# Patient Record
Sex: Female | Born: 1975 | Race: Black or African American | Hispanic: No | Marital: Single | Smoking: Current every day smoker
Health system: Southern US, Community
[De-identification: ages and names within clinical notes are randomized; demographics above are authoritative.]

## PROBLEM LIST (undated history)

## (undated) DIAGNOSIS — F32A Depression, unspecified: Secondary | ICD-10-CM

## (undated) DIAGNOSIS — G4730 Sleep apnea, unspecified: Secondary | ICD-10-CM

## (undated) DIAGNOSIS — E119 Type 2 diabetes mellitus without complications: Secondary | ICD-10-CM

## (undated) DIAGNOSIS — K219 Gastro-esophageal reflux disease without esophagitis: Secondary | ICD-10-CM

## (undated) DIAGNOSIS — N921 Excessive and frequent menstruation with irregular cycle: Secondary | ICD-10-CM

## (undated) DIAGNOSIS — J45909 Unspecified asthma, uncomplicated: Secondary | ICD-10-CM

## (undated) DIAGNOSIS — E669 Obesity, unspecified: Secondary | ICD-10-CM

## (undated) DIAGNOSIS — I10 Essential (primary) hypertension: Secondary | ICD-10-CM

## (undated) DIAGNOSIS — R519 Headache, unspecified: Secondary | ICD-10-CM

## (undated) DIAGNOSIS — T8332XA Displacement of intrauterine contraceptive device, initial encounter: Secondary | ICD-10-CM

## (undated) DIAGNOSIS — R7989 Other specified abnormal findings of blood chemistry: Secondary | ICD-10-CM

## (undated) DIAGNOSIS — D649 Anemia, unspecified: Secondary | ICD-10-CM

## (undated) DIAGNOSIS — F419 Anxiety disorder, unspecified: Secondary | ICD-10-CM

## (undated) DIAGNOSIS — K649 Unspecified hemorrhoids: Secondary | ICD-10-CM

## (undated) DIAGNOSIS — F329 Major depressive disorder, single episode, unspecified: Secondary | ICD-10-CM

## (undated) DIAGNOSIS — J189 Pneumonia, unspecified organism: Secondary | ICD-10-CM

## (undated) DIAGNOSIS — T8859XA Other complications of anesthesia, initial encounter: Secondary | ICD-10-CM

## (undated) HISTORY — DX: Sleep apnea, unspecified: G47.30

## (undated) HISTORY — PX: BREAST REDUCTION SURGERY: SHX8

## (undated) HISTORY — DX: Other specified abnormal findings of blood chemistry: R79.89

## (undated) HISTORY — DX: Unspecified hemorrhoids: K64.9

## (undated) HISTORY — DX: Obesity, unspecified: E66.9

## (undated) HISTORY — DX: Major depressive disorder, single episode, unspecified: F32.9

## (undated) HISTORY — DX: Type 2 diabetes mellitus without complications: E11.9

## (undated) HISTORY — DX: Depression, unspecified: F32.A

## (undated) HISTORY — PX: TONSILLECTOMY: SUR1361

## (undated) HISTORY — DX: Gastro-esophageal reflux disease without esophagitis: K21.9

---

## 2015-12-02 ENCOUNTER — Other Ambulatory Visit: Payer: Self-pay | Admitting: Family Medicine

## 2015-12-02 ENCOUNTER — Ambulatory Visit (INDEPENDENT_AMBULATORY_CARE_PROVIDER_SITE_OTHER): Payer: BLUE CROSS/BLUE SHIELD | Admitting: Family Medicine

## 2015-12-02 ENCOUNTER — Encounter: Payer: Self-pay | Admitting: Family Medicine

## 2015-12-02 ENCOUNTER — Ambulatory Visit
Admission: RE | Admit: 2015-12-02 | Discharge: 2015-12-02 | Disposition: A | Discharge status: Home / Self Care | Payer: BLUE CROSS/BLUE SHIELD | Source: Ambulatory Visit | Attending: Family Medicine | Admitting: Family Medicine

## 2015-12-02 DIAGNOSIS — M25562 Pain in left knee: Secondary | ICD-10-CM

## 2015-12-02 DIAGNOSIS — E538 Deficiency of other specified B group vitamins: Secondary | ICD-10-CM

## 2015-12-02 DIAGNOSIS — E119 Type 2 diabetes mellitus without complications: Secondary | ICD-10-CM | POA: Diagnosis not present

## 2015-12-02 DIAGNOSIS — Z720 Tobacco use: Secondary | ICD-10-CM | POA: Diagnosis not present

## 2015-12-02 DIAGNOSIS — Z833 Family history of diabetes mellitus: Secondary | ICD-10-CM | POA: Diagnosis not present

## 2015-12-02 DIAGNOSIS — F32A Depression, unspecified: Secondary | ICD-10-CM | POA: Insufficient documentation

## 2015-12-02 DIAGNOSIS — E559 Vitamin D deficiency, unspecified: Secondary | ICD-10-CM | POA: Diagnosis not present

## 2015-12-02 DIAGNOSIS — M25561 Pain in right knee: Secondary | ICD-10-CM | POA: Diagnosis not present

## 2015-12-02 DIAGNOSIS — R159 Full incontinence of feces: Secondary | ICD-10-CM | POA: Diagnosis not present

## 2015-12-02 DIAGNOSIS — F329 Major depressive disorder, single episode, unspecified: Secondary | ICD-10-CM

## 2015-12-02 DIAGNOSIS — F17200 Nicotine dependence, unspecified, uncomplicated: Secondary | ICD-10-CM

## 2015-12-02 DIAGNOSIS — M545 Low back pain, unspecified: Secondary | ICD-10-CM

## 2015-12-02 DIAGNOSIS — M222X1 Patellofemoral disorders, right knee: Secondary | ICD-10-CM

## 2015-12-02 LAB — POCT URINALYSIS DIPSTICK
Bilirubin, UA: NEGATIVE
Blood, UA: NEGATIVE
GLUCOSE UA: NEGATIVE
Ketones, UA: NEGATIVE
LEUKOCYTES UA: NEGATIVE
Nitrite, UA: NEGATIVE
Protein, UA: NEGATIVE
SPEC GRAV UA: 1.015
UROBILINOGEN UA: 0.2
pH, UA: 6

## 2015-12-02 NOTE — Progress Notes (Signed)
Date:  12/02/2015   Name:  Robin Hammond   DOB:  Apr 09, 1976   MRN:  [id number]  PCP:  Schuyler Amor, MD    Chief Complaint: Establish Care and Knee Pain   History of Present Illness:  This is a 40 y.o. female for initial visit. BMI 57, has gained 50# past year, no regular exercise. Thinks might be depressed (family death last year) but wants to avoid antidepressant medication. C/o 8 month hx R knee intermittent popping/swelling/instability, improves somewhat with heat. C/o 3d hx LBP with occ radiation to R buttock, can't lie flat. Taking Naprosyn prn which helps. C/o urge to defecate with eating and fecal incontinence with cough or sneeze. C/o chronic perimenstrual pain, irregular periods. Mother with DM, tetanus imm 2015, no hx colonoscopy, no mammo or pap/pelvic in 2 years. Smoker, not interested in quitting at present, helps nerves.  Review of Systems:  Review of Systems  Constitutional: Negative for fever.  Respiratory: Negative for shortness of breath.   Cardiovascular: Negative for chest pain and leg swelling.  Gastrointestinal: Negative for abdominal pain.  Endocrine: Negative for polyuria.  Genitourinary: Negative for difficulty urinating.  Neurological: Negative for syncope and light-headedness.    Patient Active Problem List   Diagnosis Date Noted  . Obesity, morbid, BMI 50 or higher (HCC) 12/02/2015  . Depression 12/02/2015  . FH: diabetes mellitus 12/02/2015  . Bilateral low back pain without sciatica 12/02/2015  . Right knee pain 12/02/2015  . Fecal incontinence 12/02/2015    Prior to Admission medications   Medication Sig Start Date End Date Taking? Authorizing Provider  naproxen (NAPROSYN) 500 MG tablet Take 500 mg by mouth 2 (two) times daily as needed.   Yes Historical Provider, MD    Allergies  Allergen Reactions  . Hydrocodone     Past Surgical History  Procedure Laterality Date  . Breast reduction surgery    . Tonsillectomy      Social History   Substance Use Topics  . Smoking status: Current Every Day Smoker  . Smokeless tobacco: None  . Alcohol Use: 0.0 oz/week    0 Standard drinks or equivalent per week    Family History  Problem Relation Age of Onset  . Diabetes Mother   . Hypertension Mother   . Cancer Maternal Grandmother   . Cancer Paternal Grandmother   . Cancer Paternal Grandfather     Medication list has been reviewed and updated.  Physical Examination: BP 104/82 mmHg  Pulse 80  Ht  (1.626 m)  Wt 334 lb (151.501 kg)  BMI 57.30 kg/m2  LMP 08/29/2015  Physical Exam  Constitutional: She is oriented to person, place, and time. She appears well-developed and well-nourished.  HENT:  Head: Normocephalic and atraumatic.  Right Ear: External ear normal.  Left Ear: External ear normal.  Nose: Nose normal.  Mouth/Throat: Oropharynx is clear and moist.  TM's clear  Eyes: Conjunctivae and EOM are normal. Pupils are equal, round, and reactive to light. No scleral icterus.  Neck: Neck supple. No thyromegaly present.  Cardiovascular: Normal rate, regular rhythm and normal heart sounds.   Pulmonary/Chest: Effort normal and breath sounds normal.  Abdominal: Soft. She exhibits no distension and no mass. There is no tenderness.  Musculoskeletal: She exhibits no edema.  Lymphadenopathy:    She has no cervical adenopathy.  Neurological: She is alert and oriented to person, place, and time. Coordination normal.  Skin: Skin is warm and dry.  Psychiatric: Her behavior is normal.  Depressed affect,  nearly tearful at times  Nursing note and vitals reviewed.   Assessment and Plan:  1. Obesity, morbid, BMI 50 or higher (HCC) Unclear etiology, worsening, check labs, exercise discussed, consider MNT - TSH - Comprehensive Metabolic Panel (CMET) - CBC - Vitamin D (25 hydroxy) - HgB A1c - ACTH - Lipid Profile - Cortisol  2. Right knee pain Suspect PFS, consider PT referral if XR negative - DG Knee Complete 4  Views Right; Future  3. Depression Declines medication, consider counseling referral - B12  4. Fecal incontinence - Ambulatory referral to Gastroenterology  5. Bilateral low back pain without sciatica UA negative, cont Naprosyn/heat prn - POCT Urinalysis Dipstick  6. Smoker Advised cessation  7. FH: diabetes mellitus - POCT Urinalysis Dipstick  Return in about 4 weeks (around 12/30/2015).  Dionne AnoWilliam M. Kingsley SpittlePlonk, Jr. MD [facility]  12/02/2015

## 2015-12-03 ENCOUNTER — Telehealth: Payer: Self-pay

## 2015-12-03 DIAGNOSIS — E119 Type 2 diabetes mellitus without complications: Secondary | ICD-10-CM | POA: Insufficient documentation

## 2015-12-03 DIAGNOSIS — E538 Deficiency of other specified B group vitamins: Secondary | ICD-10-CM | POA: Insufficient documentation

## 2015-12-03 DIAGNOSIS — E559 Vitamin D deficiency, unspecified: Secondary | ICD-10-CM | POA: Insufficient documentation

## 2015-12-03 LAB — COMPREHENSIVE METABOLIC PANEL
A/G RATIO: 1.1 (ref 1.1–2.5)
ALT: 24 IU/L (ref 0–32)
AST: 20 IU/L (ref 0–40)
Albumin: 4 g/dL (ref 3.5–5.5)
Alkaline Phosphatase: 79 IU/L (ref 39–117)
BUN/Creatinine Ratio: 9 (ref 9–23)
BUN: 7 mg/dL (ref 6–24)
Bilirubin Total: <0.2 mg/dL (ref 0.0–1.2)
CALCIUM: 9.6 mg/dL (ref 8.7–10.2)
CO2: 25 mmol/L (ref 18–29)
CREATININE: 0.82 mg/dL (ref 0.57–1.00)
Chloride: 102 mmol/L (ref 96–106)
GFR, EST AFRICAN AMERICAN: 104 mL/min/{1.73_m2} (ref >59)
GFR, EST NON AFRICAN AMERICAN: 90 mL/min/{1.73_m2} (ref >59)
GLOBULIN, TOTAL: 3.5 g/dL (ref 1.5–4.5)
Glucose: 117 mg/dL — ABNORMAL HIGH (ref 65–99)
POTASSIUM: 4.4 mmol/L (ref 3.5–5.2)
Sodium: 143 mmol/L (ref 134–144)
TOTAL PROTEIN: 7.5 g/dL (ref 6.0–8.5)

## 2015-12-03 LAB — HEMOGLOBIN A1C
Est. average glucose Bld gHb Est-mCnc: 151 mg/dL
Hgb A1c MFr Bld: 6.9 % — ABNORMAL HIGH (ref 4.8–5.6)

## 2015-12-03 LAB — CBC
HEMOGLOBIN: 13.6 g/dL (ref 11.1–15.9)
Hematocrit: 41 % (ref 34.0–46.6)
MCH: 30 pg (ref 26.6–33.0)
MCHC: 33.2 g/dL (ref 31.5–35.7)
MCV: 91 fL (ref 79–97)
Platelets: 304 10*3/uL (ref 150–379)
RBC: 4.53 x10E6/uL (ref 3.77–5.28)
RDW: 14.6 % (ref 12.3–15.4)
WBC: 10.8 10*3/uL (ref 3.4–10.8)

## 2015-12-03 LAB — VITAMIN D 25 HYDROXY (VIT D DEFICIENCY, FRACTURES): Vit D, 25-Hydroxy: 15.6 ng/mL — ABNORMAL LOW (ref 30.0–100.0)

## 2015-12-03 LAB — TSH: TSH: 3.38 u[IU]/mL (ref 0.450–4.500)

## 2015-12-03 LAB — LIPID PANEL
CHOLESTEROL TOTAL: 155 mg/dL (ref 100–199)
Chol/HDL Ratio: 3.4 ratio units (ref 0.0–4.4)
HDL: 45 mg/dL (ref >39)
LDL CALC: 74 mg/dL (ref 0–99)
TRIGLYCERIDES: 182 mg/dL — AB (ref 0–149)
VLDL CHOLESTEROL CAL: 36 mg/dL (ref 5–40)

## 2015-12-03 LAB — ACTH: ACTH: 15.3 pg/mL (ref 7.2–63.3)

## 2015-12-03 LAB — VITAMIN B12: Vitamin B-12: 291 pg/mL (ref 211–946)

## 2015-12-03 LAB — CORTISOL: Cortisol: 3.2 ug/dL

## 2015-12-03 MED ORDER — METFORMIN HCL 500 MG PO TABS: 500.0000 mg | ORAL_TABLET | Freq: Two times a day (BID) | ORAL | Status: DC

## 2015-12-03 MED ORDER — VITAMIN B-12 1000 MCG PO TABS: 1000.0000 ug | ORAL_TABLET | Freq: Every day | ORAL | Status: DC

## 2015-12-03 MED ORDER — VITAMIN D 50 MCG (2000 UT) PO CAPS: 1.0000 | ORAL_CAPSULE | Freq: Every day | ORAL | Status: DC

## 2015-12-03 NOTE — Telephone Encounter (Signed)
-----   Message from Schuyler AmorWilliam Plonk, MD sent at 12/03/2015 10:04 AM EST ----- Inform blood work shows diabetes, low vit D level, and low B12 level (should be over 300). Recommend begin metformin 500 mg bid (rx sent), OTC vit D3 2000 units daily, and OTC vit B12 1000 mcg daily.

## 2015-12-03 NOTE — Addendum Note (Signed)
Addended by: Schuyler AmorPLONK, Kynlie Jane on: 12/03/2015 10:08 AM   Modules accepted: Orders, SmartSet

## 2015-12-03 NOTE — Telephone Encounter (Signed)
Spoke with patient. Patient advised of all results and verbalized understanding. Will call back with any future questions or concerns. MAH  

## 2015-12-04 ENCOUNTER — Encounter: Payer: Self-pay | Admitting: Gastroenterology

## 2015-12-04 ENCOUNTER — Ambulatory Visit (INDEPENDENT_AMBULATORY_CARE_PROVIDER_SITE_OTHER): Payer: BLUE CROSS/BLUE SHIELD | Admitting: Gastroenterology

## 2015-12-04 VITALS — BP 131/98 | HR 82 | Temp 99.0°F | Ht 64.0 in | Wt 334.0 lb

## 2015-12-04 DIAGNOSIS — R198 Other specified symptoms and signs involving the digestive system and abdomen: Secondary | ICD-10-CM

## 2015-12-04 DIAGNOSIS — R197 Diarrhea, unspecified: Secondary | ICD-10-CM

## 2015-12-04 DIAGNOSIS — K219 Gastro-esophageal reflux disease without esophagitis: Secondary | ICD-10-CM

## 2015-12-04 DIAGNOSIS — R194 Change in bowel habit: Secondary | ICD-10-CM

## 2015-12-04 MED ORDER — PANTOPRAZOLE SODIUM 40 MG PO TBEC: 40.0000 mg | DELAYED_RELEASE_TABLET | Freq: Every day | ORAL | Status: DC

## 2015-12-04 MED ORDER — DICYCLOMINE HCL 20 MG PO TABS: 20.0000 mg | ORAL_TABLET | Freq: Three times a day (TID) | ORAL | Status: DC

## 2015-12-04 NOTE — Progress Notes (Signed)
Gastroenterology Consultation  Referring Provider:     Schuyler Hammond, William, MD Primary Care Physician:  Robin AmorWilliam Plonk, MD Primary Gastroenterologist:  Dr. Servando Hammond     Reason for Consultation:     Stool incontinence        HPI:   Robin Hammond is a 40 y.o. y/o female referred for consultation & management of  Stool incontinence and heartburn by Dr. Schuyler AmorWilliam Plonk, MD.   This patient comes today with a history of frequent bowel movements after she eats. The patient reports that shortly after having a meal she has to run for the bathroom. She also reports that her stools are loose. She has had problems with incontinence when she coughs or sneezes. She also reports that her mother had colon polyps at the age of 40. There is no report of any unexplained weight loss and in fact the patient states she has gained weight. She has been under a lot of stress. The patient has daily heartburn for which she does not take any medication. There is no report of any food getting stuck in the esophagus or having to bring food back up.   The patient reports that she has been having some bright red blood on the toilet paper. She does not see any blood in the stool or in the water. She also denies any constipation.  Past Medical History  Diagnosis Date  . Hemorrhoids   . Diabetes mellitus without complication (HCC)   . Depression     Past Surgical History  Procedure Laterality Date  . Breast reduction surgery    . Tonsillectomy      Prior to Admission medications   Medication Sig Start Date End Date Taking? Authorizing Provider  metFORMIN (GLUCOPHAGE) 500 MG tablet Take 1 tablet (500 mg total) by mouth 2 (two) times daily with a meal. 12/03/15  Yes Robin AmorWilliam Plonk, MD  naproxen (NAPROSYN) 500 MG tablet Take 500 mg by mouth 2 (two) times daily as needed.   Yes Historical Provider, MD  vitamin B-12 (CYANOCOBALAMIN) 1000 MCG tablet Take 1 tablet (1,000 mcg total) by mouth daily. 12/03/15  Yes Robin AmorWilliam Plonk, MD    Cholecalciferol (VITAMIN D) 2000 units CAPS Take 1 capsule (2,000 Units total) by mouth daily. Patient not taking: Reported on 12/04/2015 12/03/15   Robin AmorWilliam Plonk, MD  dicyclomine (BENTYL) 20 MG tablet Take 1 tablet (20 mg total) by mouth 3 (three) times daily before meals. 12/04/15   Robin Hammond Robin Curto, MD  pantoprazole (PROTONIX) 40 MG tablet Take 1 tablet (40 mg total) by mouth daily before breakfast. 12/04/15   Robin Hammond Robin Stansel, MD    Family History  Problem Relation Age of Onset  . Diabetes Mother   . Hypertension Mother   . Cancer Maternal Grandmother   . Cancer Paternal Grandmother   . Cancer Paternal Grandfather      Social History  Substance Use Topics  . Smoking status: Current Every Day Smoker  . Smokeless tobacco: Never Used  . Alcohol Use: 0.0 oz/week    0 Standard drinks or equivalent per week    Allergies as of 12/04/2015 - Review Complete 12/04/2015  Allergen Reaction Noted  . Hydrocodone  12/02/2015    Review of Systems:    All systems reviewed and negative except where noted in HPI.   Physical Exam:  BP 131/98 mmHg  Pulse 82  Temp(Src) 99 F (37.2 C)  Ht 5\' 4"  (1.626 m)  Wt 334 lb (151.501 kg)  BMI 57.30 kg/m2  LMP  08/29/2015 Patient's last menstrual period was 08/29/2015. Psych:  Alert and cooperative. Normal mood and affect. General:   Alert,  Well-developed,  Obese,well-nourished, pleasant and cooperative in NAD Head:  Normocephalic and atraumatic. Eyes:  Sclera clear, no icterus.   Conjunctiva pink. Ears:  Normal auditory acuity. Nose:  No deformity, discharge, or lesions. Mouth:  No deformity or lesions,oropharynx pink & moist. Neck:  Supple; no masses or thyromegaly. Lungs:  Respirations even and unlabored.  Clear throughout to auscultation.   No wheezes, crackles, or rhonchi. No acute distress. Heart:  Regular rate and rhythm; no murmurs, clicks, rubs, or gallops. Abdomen:  Normal bowel sounds.  No bruits.  Soft, non-tender and non-distended without masses,  hepatosplenomegaly or hernias noted.  No guarding or rebound tenderness.  Negative Carnett sign.   Rectal:  Deferred.  Msk:  Symmetrical without gross deformities.  Good, equal movement & strength bilaterally. Pulses:  Normal pulses noted. Extremities:  No clubbing or edema.  No cyanosis. Neurologic:  Alert and oriented x3;  grossly normal neurologically. Skin:  Intact without significant lesions or rashes.  No jaundice. Lymph Nodes:  No significant cervical adenopathy. Psych:  Alert and cooperative. Normal mood and affect.  Imaging Studies: Dg Knee Complete 4 Views Right  12/02/2015  CLINICAL DATA:  Intermittent right medial knee pain for the past 6 months without known injury ; weight-bearing is painful. EXAM: RIGHT KNEE - COMPLETE 4+ VIEW COMPARISON:  None in PACs FINDINGS: The bones of the right knee are adequately mineralized. The joint spaces are preserved. There is no acute fracture nor dislocation. There is no joint effusion. Specific attention to the patella reveals no acute bony abnormality. There are small spurs arising from the lateral articular margin of the patella. IMPRESSION: There is no acute bony abnormality of the right knee. Very mild spurring is noted involving the patella. Electronically Signed   By: David  [place] M.D.   On: 12/02/2015 16:19    Assessment and Plan:   Robin Hammond is a 40 y.o. y/o female who comes in today with a change in bowel habits predominantly consisting of diarrhea. The patient reports that she has to run to the bathroom shortly after she eats and has had some stool incontinence. She also reports that she has abdominal cramps with this. The patient will be started on a trial of dicyclomine before every meal. She will also be set up for a colonoscopy due to her change in bowel habits, diarrhea and family history of colon polyps. The patient has will have Protonix ordered for her for her daily heartburn.  I have discussed risks & benefits which include, but  are not limited to, bleeding, infection, perforation & drug reaction.  The patient agrees with this plan & written consent will be obtained.    Note: This dictation was prepared with Dragon dictation along with smaller phrase technology. Any transcriptional errors that result from this process are unintentional.

## 2015-12-05 ENCOUNTER — Other Ambulatory Visit: Payer: Self-pay

## 2015-12-16 ENCOUNTER — Telehealth: Payer: Self-pay

## 2015-12-16 ENCOUNTER — Other Ambulatory Visit: Payer: Self-pay

## 2015-12-16 DIAGNOSIS — E119 Type 2 diabetes mellitus without complications: Secondary | ICD-10-CM

## 2015-12-16 NOTE — Telephone Encounter (Signed)
Patient afraid what will happen if not on meds. I explained how A1C works and that hers is not Fatal read and we have room to wiggle. She will call back Wed to see how she is feeling and awaits referral to [facility] I have ordered this. Patient asked will she start meds again or need new on and I advised diet changes and said lets see how you are in few days and after [facility].

## 2015-12-16 NOTE — Telephone Encounter (Signed)
Ok thanks 

## 2015-12-16 NOTE — Telephone Encounter (Signed)
Stop metformin, refer to [facility] for diabetic education please.

## 2015-12-16 NOTE — Telephone Encounter (Signed)
Patient is feeling Dizzy and shaking since taking Metformin. Just Dx with diabetes and said she was never told how to eat or given BS machine when asked if she is checking sugars. I asked has she eaten anything she said just breakfast and small lunch and she said she does not know what to eat as diabetic. I also asked if she was having 3 meals and 2 snacks and she said no she was never told to. I explained this is part of any diet and that I will see if you want to order Kit for BS testing or if she needs [facility]. She said she has not had any chest pains or confussion only dizzy and some nausea. Sherrill Raring has her on meds keeping her from having lose stools so not sure if she would have had diarrhea or not. What should I do?

## 2015-12-24 ENCOUNTER — Telehealth: Payer: Self-pay

## 2015-12-24 ENCOUNTER — Ambulatory Visit: Payer: BLUE CROSS/BLUE SHIELD | Admitting: Physical Therapy

## 2015-12-24 NOTE — Telephone Encounter (Signed)
Sent to Plonk 

## 2015-12-25 MED ORDER — CITALOPRAM HYDROBROMIDE 20 MG PO TABS: 20.0000 mg | ORAL_TABLET | Freq: Every day | ORAL | Status: DC

## 2015-12-25 NOTE — Telephone Encounter (Signed)
I can call in antidepressant. Has she tried any in the past? Will also do [facility] referral.

## 2015-12-25 NOTE — Addendum Note (Signed)
Addended by: Schuyler AmorPLONK, Jackob Crookston on: 12/25/2015 11:47 AM   Modules accepted: Orders

## 2015-12-25 NOTE — Telephone Encounter (Signed)
Please send in Antidepressant for pt- has NOT tried anything in past- will sched follow up for 3 weeks and yes she does want a lifestyle referral please

## 2015-12-26 ENCOUNTER — Ambulatory Visit: Payer: BLUE CROSS/BLUE SHIELD | Admitting: Family Medicine

## 2015-12-30 ENCOUNTER — Ambulatory Visit: Payer: BLUE CROSS/BLUE SHIELD | Admitting: Family Medicine

## 2015-12-31 ENCOUNTER — Encounter: Payer: BLUE CROSS/BLUE SHIELD | Attending: Family Medicine | Admitting: Dietician

## 2015-12-31 ENCOUNTER — Encounter: Payer: Self-pay | Admitting: Dietician

## 2015-12-31 VITALS — BP 110/82 | Ht 64.0 in | Wt 330.5 lb

## 2015-12-31 DIAGNOSIS — E119 Type 2 diabetes mellitus without complications: Secondary | ICD-10-CM | POA: Insufficient documentation

## 2015-12-31 NOTE — Progress Notes (Signed)
Diabetes Self-Management Education  Visit Type: First/Initial  Appt. Start Time: 1030 Appt. End Time: 1130  12/31/2015  [name], identified by name and date of birth, is a 40 y.o. female with a diagnosis of Diabetes: Type 2.   ASSESSMENT  Blood pressure 110/82, height  (1.626 m), weight 330 lb 8 oz (149.914 kg), last menstrual period 08/29/2015. Body mass index is 56.7 kg/(m^2).  Obesity Lacks knowledge of diabetes care and diet Is not testing BG's-does not have a BG meter      Diabetes Self-Management Education - 12/31/15 1327    Visit Information   Visit Type First/Initial   Initial Visit   Diabetes Type Type 2   Health Coping   How would you rate your overall health? Fair   Psychosocial Assessment   Patient Belief/Attitude about Diabetes Motivated to manage diabetes   Self-care barriers None   Patient Concerns Medication;Monitoring;Healthy Lifestyle;Problem Solving;Glycemic Control;Weight Control;Nutrition/Meal planning  quit smoking   Special Needs None   Preferred Learning Style Auditory   Learning Readiness Ready   What is the last grade level you completed in school? 14   Complications   Last HgB A1C per patient/outside source 6.9 %  per pt   How often do you check your blood sugar? 0 times/day (not testing)   Have you had a dilated eye exam in the past 12 months? No   Have you had a dental exam in the past 12 months? No   Are you checking your feet? Yes   How many days per week are you checking your feet? 7   Dietary Intake   Breakfast --  pt works 5:30p-3:30a-usually eats cold cereal and milk after work before going to sleep then eats breakfast meal at 10-11a when awakens   Lunch --  eats at 5p; eats fried foods , snack foods and sweets daily   Dinner --  eats at [employer]) --  drinks 5+ fruit juices/day, 4 glasses of sweetened beverages; drinks very little water   Exercise   Exercise Type --  no regular exercise   Patient Education    Previous Diabetes Education No   Disease state  --  discussed pathophysiology of type 2 diabetes and treatment options   Nutrition management  Role of diet in the treatment of diabetes and the relationship between the three main macronutrients and blood glucose level;Food label reading, portion sizes and measuring food.;Carbohydrate counting   Physical activity and exercise  Role of exercise on diabetes management, blood pressure control and cardiac health.;Helped patient identify appropriate exercises in relation to his/her diabetes, diabetes complications and other health issue.   Medications --  pt was on metformin but stopped it due to GI symptoms   Monitoring Taught/evaluated SMBG meter.;Purpose and frequency of SMBG.;Taught/discussed recording of test results and interpretation of SMBG.;Identified appropriate SMBG and/or A1C goals.;Yearly dilated eye exam  gave pt Ultra One Touch 2 meter and instructed on its use-FBG 91   Chronic complications Relationship between chronic complications and blood glucose control;Dental care;Retinopathy and reason for yearly dilated eye exams   Personal strategies to promote health Lifestyle issues that need to be addressed for better diabetes care-gave pt information on free  smoking cessation classes      Individualized Plan for Diabetes Self-Management Training:   Learning Objective:  Patient will have a greater understanding of diabetes self-management. Patient education plan is to attend individual and/or group sessions per assessed needs and concerns.   Plan:  Check BG's 2x/day-FBG and 2 hr pp and record Try walking 10-15 min 3-4x/wk. Avoid sweetened beverages and fruit juices-drink plenty of water Limit fried foods, snack foods and sweets Make healthy food choices Eat 3 balanced meals/day-include 2-3 carbohydrate servings/meal + protein Eat a small bedtime snack-include 1 carbohydrate serving/snack + protein Make dental and eye  appointments Call if questions Quit smoking   Expected Outcomes:   positive  Education material provided: General Meal Planning Guidelines, Ultra One Touch 2 meter  If problems or questions, patient to contact team via:  [phone]  Future DSME appointment:  01-09-16

## 2016-01-09 ENCOUNTER — Telehealth: Payer: Self-pay | Admitting: Gastroenterology

## 2016-01-09 ENCOUNTER — Ambulatory Visit: Payer: BLUE CROSS/BLUE SHIELD

## 2016-01-09 NOTE — Telephone Encounter (Signed)
I have called [employer] [place] and spoke with Lamarr LulasNeka J. No authorization is required for CPT: [id number] as long as it remains outpatient for DOS:01/14/16 with Dr Servando SnareWohl.

## 2016-01-13 ENCOUNTER — Telehealth: Payer: Self-pay | Admitting: Gastroenterology

## 2016-01-13 ENCOUNTER — Encounter: Payer: Self-pay | Admitting: Dietician

## 2016-01-13 ENCOUNTER — Telehealth: Payer: Self-pay | Admitting: Dietician

## 2016-01-13 NOTE — Telephone Encounter (Signed)
Pt called on 01-09-16 and cancelled diabetes classes-does not want to reschedule

## 2016-01-13 NOTE — Telephone Encounter (Signed)
Questions answer about colonoscopy prep instructions. Pt requested an email with [facility]. Emailed to: [email or web address].

## 2016-01-13 NOTE — Telephone Encounter (Signed)
Patient left a voice message regarding some questions about her procedure tomorrow.

## 2016-01-14 ENCOUNTER — Encounter
Admission: RE | Disposition: A | Discharge status: Home / Self Care | Payer: Self-pay | Source: Ambulatory Visit | Attending: Gastroenterology

## 2016-01-14 ENCOUNTER — Ambulatory Visit: Payer: BLUE CROSS/BLUE SHIELD | Admitting: Anesthesiology

## 2016-01-14 ENCOUNTER — Ambulatory Visit
Admission: RE | Admit: 2016-01-14 | Discharge: 2016-01-14 | Disposition: A | Discharge status: Home / Self Care | Payer: BLUE CROSS/BLUE SHIELD | Source: Ambulatory Visit | Attending: Gastroenterology | Admitting: Gastroenterology

## 2016-01-14 ENCOUNTER — Encounter: Payer: Self-pay | Admitting: Anesthesiology

## 2016-01-14 DIAGNOSIS — Z79899 Other long term (current) drug therapy: Secondary | ICD-10-CM | POA: Diagnosis not present

## 2016-01-14 DIAGNOSIS — E119 Type 2 diabetes mellitus without complications: Secondary | ICD-10-CM | POA: Diagnosis not present

## 2016-01-14 DIAGNOSIS — K219 Gastro-esophageal reflux disease without esophagitis: Secondary | ICD-10-CM | POA: Insufficient documentation

## 2016-01-14 DIAGNOSIS — Z6841 Body Mass Index (BMI) 40.0 and over, adult: Secondary | ICD-10-CM | POA: Insufficient documentation

## 2016-01-14 DIAGNOSIS — K640 First degree hemorrhoids: Secondary | ICD-10-CM | POA: Insufficient documentation

## 2016-01-14 DIAGNOSIS — Z7984 Long term (current) use of oral hypoglycemic drugs: Secondary | ICD-10-CM | POA: Diagnosis not present

## 2016-01-14 DIAGNOSIS — Z885 Allergy status to narcotic agent status: Secondary | ICD-10-CM | POA: Insufficient documentation

## 2016-01-14 DIAGNOSIS — Z809 Family history of malignant neoplasm, unspecified: Secondary | ICD-10-CM | POA: Insufficient documentation

## 2016-01-14 DIAGNOSIS — F17210 Nicotine dependence, cigarettes, uncomplicated: Secondary | ICD-10-CM | POA: Diagnosis not present

## 2016-01-14 DIAGNOSIS — Z833 Family history of diabetes mellitus: Secondary | ICD-10-CM | POA: Diagnosis not present

## 2016-01-14 DIAGNOSIS — R197 Diarrhea, unspecified: Secondary | ICD-10-CM | POA: Diagnosis present

## 2016-01-14 DIAGNOSIS — F329 Major depressive disorder, single episode, unspecified: Secondary | ICD-10-CM | POA: Diagnosis not present

## 2016-01-14 DIAGNOSIS — K529 Noninfective gastroenteritis and colitis, unspecified: Secondary | ICD-10-CM | POA: Insufficient documentation

## 2016-01-14 DIAGNOSIS — G4730 Sleep apnea, unspecified: Secondary | ICD-10-CM | POA: Insufficient documentation

## 2016-01-14 DIAGNOSIS — E559 Vitamin D deficiency, unspecified: Secondary | ICD-10-CM | POA: Diagnosis not present

## 2016-01-14 DIAGNOSIS — E669 Obesity, unspecified: Secondary | ICD-10-CM | POA: Insufficient documentation

## 2016-01-14 DIAGNOSIS — Z8249 Family history of ischemic heart disease and other diseases of the circulatory system: Secondary | ICD-10-CM | POA: Diagnosis not present

## 2016-01-14 HISTORY — PX: COLONOSCOPY WITH PROPOFOL: SHX5780

## 2016-01-14 LAB — GLUCOSE, CAPILLARY: GLUCOSE-CAPILLARY: 106 mg/dL — AB (ref 65–99)

## 2016-01-14 LAB — POCT PREGNANCY, URINE: PREG TEST UR: NEGATIVE

## 2016-01-14 SURGERY — COLONOSCOPY WITH PROPOFOL
Anesthesia: General

## 2016-01-14 MED ORDER — FENTANYL CITRATE (PF) 100 MCG/2ML IJ SOLN
INTRAMUSCULAR | Status: DC | PRN
  Administered 2016-01-14: 50 ug via INTRAVENOUS

## 2016-01-14 MED ORDER — PHENYLEPHRINE HCL 10 MG/ML IJ SOLN
INTRAMUSCULAR | Status: DC | PRN
  Administered 2016-01-14 (×2): 150 ug via INTRAVENOUS

## 2016-01-14 MED ORDER — SODIUM CHLORIDE 0.9 % IV SOLN
INTRAVENOUS | Status: DC
  Administered 2016-01-14 (×2): via INTRAVENOUS

## 2016-01-14 MED ORDER — MIDAZOLAM HCL 2 MG/2ML IJ SOLN
INTRAMUSCULAR | Status: DC | PRN
  Administered 2016-01-14: 1 mg via INTRAVENOUS

## 2016-01-14 MED ORDER — PROPOFOL 10 MG/ML IV BOLUS
INTRAVENOUS | Status: DC | PRN
  Administered 2016-01-14: 30 mg via INTRAVENOUS
  Administered 2016-01-14: 40 mg via INTRAVENOUS

## 2016-01-14 MED ORDER — PROPOFOL 500 MG/50ML IV EMUL
INTRAVENOUS | Status: DC | PRN
  Administered 2016-01-14: 80 ug/kg/min via INTRAVENOUS

## 2016-01-14 NOTE — H&P (Signed)
[facility]  [address] [place], [place] [address] Phone: [phone] Fax : [phone]  Primary Care Physician:  Schuyler Amor, MD Primary Gastroenterologist:  Dr. Servando Snare  Pre-Procedure History & Physical: HPI:  Robin Hammond is a 40 y.o. female is here for an colonoscopy.   Past Medical History  Diagnosis Date  . Hemorrhoids   . Diabetes mellitus without complication (HCC)   . Depression   . GERD (gastroesophageal reflux disease)   . Low serum vitamin D   . Obesity   . Sleep apnea     Past Surgical History  Procedure Laterality Date  . Breast reduction surgery    . Tonsillectomy      Prior to Admission medications   Medication Sig Start Date End Date Taking? Authorizing Provider  Cholecalciferol (VITAMIN D) 2000 units CAPS Take 1 capsule (2,000 Units total) by mouth daily. Patient not taking: Reported on 12/04/2015 12/03/15   Schuyler Amor, MD  citalopram (CELEXA) 20 MG tablet Take 1 tablet (20 mg total) by mouth daily. 12/25/15   Schuyler Amor, MD  dicyclomine (BENTYL) 20 MG tablet Take 1 tablet (20 mg total) by mouth 3 (three) times daily before meals. Patient not taking: Reported on 12/31/2015 12/04/15   Midge Minium, MD  metFORMIN (GLUCOPHAGE) 500 MG tablet Take 1 tablet (500 mg total) by mouth 2 (two) times daily with a meal. Patient not taking: Reported on 12/31/2015 12/03/15   Schuyler Amor, MD  naproxen (NAPROSYN) 500 MG tablet Take 500 mg by mouth 2 (two) times daily as needed.    Historical Provider, MD  pantoprazole (PROTONIX) 40 MG tablet Take 1 tablet (40 mg total) by mouth daily before breakfast. 12/04/15   Midge Minium, MD  vitamin B-12 (CYANOCOBALAMIN) 1000 MCG tablet Take 1 tablet (1,000 mcg total) by mouth daily. 12/03/15   Schuyler Amor, MD    Allergies as of 12/05/2015 - Review Complete 12/04/2015  Allergen Reaction Noted  . Hydrocodone  12/02/2015    Family History  Problem Relation Age of Onset  . Diabetes Mother   . Hypertension  Mother   . Cancer Maternal Grandmother   . Cancer Paternal Grandmother   . Cancer Paternal Grandfather     Social History   Social History  . Marital Status: Single    Spouse Name: N/A  . Number of Children: N/A  . Years of Education: N/A   Occupational History  . Not on file.   Social History Main Topics  . Smoking status: Current Every Day Smoker -- 0.30 packs/day    Types: Cigarettes  . Smokeless tobacco: Never Used  . Alcohol Use: 1.8 - 2.4 oz/week    3-4 Standard drinks or equivalent per week  . Drug Use: No  . Sexual Activity: Not Currently   Other Topics Concern  . Not on file   Social History Narrative    Review of Systems: See HPI, otherwise negative ROS  Physical Exam: BP 127/81 mmHg  Pulse 79  Temp(Src) 98.8 F (37.1 C) (Tympanic)  Resp 21  Ht  (1.626 m)  Wt 336 lb (152.409 kg)  BMI 57.65 kg/m2  SpO2 99% General:   Alert,  pleasant and cooperative in NAD Head:  Normocephalic and atraumatic. Neck:  Supple; no masses or thyromegaly. Lungs:  Clear throughout to auscultation.    Heart:  Regular rate and rhythm. Abdomen:  Soft, nontender and nondistended. Normal bowel sounds, without guarding, and without rebound.   Neurologic:  Alert and  oriented  x4;  grossly normal neurologically.  Impression/Plan: Robin Hammond is here for an colonoscopy to be performed for diarrhea  Risks, benefits, limitations, and alternatives regarding  colonoscopy have been reviewed with the patient.  Questions have been answered.  All parties agreeable.   [name] [name], MD  01/14/2016, 8:34 AM

## 2016-01-14 NOTE — Anesthesia Preprocedure Evaluation (Addendum)
Anesthesia Evaluation  Patient identified by MRN, date of birth, ID band Patient awake    Reviewed: Allergy & Precautions, NPO status , Patient's Chart, lab work & pertinent test results, reviewed documented beta blocker date and time   Airway Mallampati: III  TM Distance: >3 FB     Dental  (+) Chipped, Missing, Dental Advisory Given   Pulmonary sleep apnea , Current Smoker,           Cardiovascular      Neuro/Psych PSYCHIATRIC DISORDERS Depression    GI/Hepatic GERD  ,  Endo/Other  diabetes, Type obesity  Renal/GU      Musculoskeletal   Abdominal   Peds  Hematology   Anesthesia Other Findings No cpap.   Reproductive/Obstetrics                            Anesthesia Physical Anesthesia Plan  ASA: III  Anesthesia Plan: General   Post-op Pain Management:    Induction: Intravenous  Airway Management Planned:   Additional Equipment:   Intra-op Plan:   Post-operative Plan:   Informed Consent: I have reviewed the patients History and Physical, chart, labs and discussed the procedure including the risks, benefits and alternatives for the proposed anesthesia with the patient or authorized representative who has indicated his/her understanding and acceptance.     Plan Discussed with: CRNA  Anesthesia Plan Comments:         Anesthesia Quick Evaluation

## 2016-01-14 NOTE — Transfer of Care (Signed)
Immediate Anesthesia Transfer of Care Note  Patient: Robin Hammond  Procedure(s) Performed: Procedure(s): COLONOSCOPY WITH PROPOFOL (N/A)  Patient Location: PACU  Anesthesia Type:General  Level of Consciousness: awake, alert  and oriented  Airway & Oxygen Therapy: Patient Spontanous Breathing and Patient connected to nasal cannula oxygen  Post-op Assessment: Report given to RN and Post -op Vital signs reviewed and stable  Post vital signs: Reviewed and stable  Last Vitals:  Filed Vitals:   01/14/16 0829 01/14/16 0930  BP: 127/81   Pulse: 79   Temp: 37.1 C 36.4 C  Resp: 21     Complications: No apparent anesthesia complications

## 2016-01-14 NOTE — Anesthesia Postprocedure Evaluation (Signed)
Anesthesia Post Note  Patient: Robin Hammond  Procedure(s) Performed: Procedure(s) (LRB): COLONOSCOPY WITH PROPOFOL (N/A)  Patient location during evaluation: Endoscopy Anesthesia Type: General Level of consciousness: awake and alert Pain management: pain level controlled Vital Signs Assessment: post-procedure vital signs reviewed and stable Respiratory status: spontaneous breathing, nonlabored ventilation, respiratory function stable and patient connected to nasal cannula oxygen Cardiovascular status: blood pressure returned to baseline and stable Postop Assessment: no signs of nausea or vomiting Anesthetic complications: no    Last Vitals:  Filed Vitals:   01/14/16 0930 01/14/16 0950  BP:  114/68  Pulse:    Temp: 36.4 C   Resp:      Last Pain:  Filed Vitals:   01/14/16 1020  PainSc: 0-No pain                 Fuquan Wilson S

## 2016-01-14 NOTE — Op Note (Signed)
[facility] Gastroenterology Patient Name: Robin Hammond Procedure Date: 01/14/2016 9:09 AM MRN: [id number] Account #: [id number] Date of Birth: 06-05-1976 Admit Type: Outpatient Age: 40 Room: [facility] ENDO ROOM 4 Gender: Female Note Status: Finalized Procedure:            Colonoscopy Indications:          Chronic diarrhea Providers:            Midge Minium, MD Referring MD:         Dionne Ano. Plonk, MD (Referring MD) Medicines:            Propofol per Anesthesia Complications:        No immediate complications. Procedure:            Pre-Anesthesia Assessment:                       - Prior to the procedure, a History and Physical was                        performed, and patient medications and allergies were                        reviewed. The patient's tolerance of previous                        anesthesia was also reviewed. The risks and benefits of                        the procedure and the sedation options and risks were                        discussed with the patient. All questions were                        answered, and informed consent was obtained. Prior                        Anticoagulants: The patient has taken no previous                        anticoagulant or antiplatelet agents. ASA Grade                        Assessment: II - A patient with mild systemic disease.                        After reviewing the risks and benefits, the patient was                        deemed in satisfactory condition to undergo the                        procedure.                       After obtaining informed consent, the colonoscope was                        passed under direct vision. Throughout the procedure,  the patient's blood pressure, pulse, and oxygen                        saturations were monitored continuously. The                        Colonoscope was introduced through the anus and                        advanced to the the  terminal ileum. The colonoscopy was                        performed without difficulty. The patient tolerated the                        procedure well. The quality of the bowel preparation                        was excellent. Findings:      The perianal and digital rectal examinations were normal.      Non-bleeding internal hemorrhoids were found during retroflexion. The       hemorrhoids were Grade I (internal hemorrhoids that do not prolapse).      The terminal ileum appeared normal. Biopsies were taken with a cold       forceps for histology.      Biopsies were taken with a cold forceps in the entire colon for       histology. Impression:           - Non-bleeding internal hemorrhoids.                       - The examined portion of the ileum was normal.                        Biopsied.                       - Biopsies were taken with a cold forceps for histology                        in the entire colon. Recommendation:       - Await pathology results. Procedure Code(s):    --- Professional ---                       [phone], Colonoscopy, flexible; with biopsy, single or                        multiple Diagnosis Code(s):    --- Professional ---                       K52.9, Noninfective gastroenteritis and colitis,                        unspecified CPT copyright 2016 American Medical Association. All rights reserved. The codes documented in this report are preliminary and upon coder review may  be revised to meet current compliance requirements. Midge Minium, MD 01/14/2016 9:32:01 AM This report has been signed electronically. Number of Addenda: 0 Note Initiated On: 01/14/2016 9:09 AM Scope Withdrawal Time: 0 hours 5 minutes 28 seconds  Total Procedure  Duration: 0 hours 9 minutes 48 seconds       [facility]

## 2016-01-15 ENCOUNTER — Encounter: Payer: Self-pay | Admitting: Gastroenterology

## 2016-01-15 NOTE — OR Nursing (Signed)
Eating at present. Instructed to call office if any further nauseau or vomiting.

## 2016-01-16 ENCOUNTER — Ambulatory Visit: Payer: BLUE CROSS/BLUE SHIELD

## 2016-01-16 LAB — SURGICAL PATHOLOGY

## 2016-01-20 ENCOUNTER — Telehealth: Payer: Self-pay

## 2016-01-20 NOTE — Telephone Encounter (Signed)
-----   Message from Midge Miniumarren Wohl, MD sent at 01/20/2016 11:32 AM EDT ----- The patient know that biopsies of her small bowel and colon were all normal and showed no sign of a cause for her diarrhea. The patient should try taking regularly scheduled Imodium to keep her diarrhea under control.

## 2016-01-20 NOTE — Telephone Encounter (Signed)
Pt notified of procedure results. 

## 2016-01-22 ENCOUNTER — Other Ambulatory Visit: Payer: Self-pay

## 2016-01-22 ENCOUNTER — Ambulatory Visit: Payer: BLUE CROSS/BLUE SHIELD | Admitting: Family Medicine

## 2016-01-23 ENCOUNTER — Ambulatory Visit: Payer: BLUE CROSS/BLUE SHIELD

## 2016-04-24 ENCOUNTER — Encounter: Payer: Self-pay | Admitting: Family Medicine

## 2016-04-24 ENCOUNTER — Ambulatory Visit (INDEPENDENT_AMBULATORY_CARE_PROVIDER_SITE_OTHER): Payer: BLUE CROSS/BLUE SHIELD | Admitting: Family Medicine

## 2016-04-24 VITALS — HR 80 | Ht 64.0 in | Wt 323.0 lb

## 2016-04-24 DIAGNOSIS — N9489 Other specified conditions associated with female genital organs and menstrual cycle: Secondary | ICD-10-CM

## 2016-04-24 DIAGNOSIS — R102 Pelvic and perineal pain: Secondary | ICD-10-CM

## 2016-04-24 MED ORDER — SULFAMETHOXAZOLE-TRIMETHOPRIM 800-160 MG PO TABS: 1.0000 | ORAL_TABLET | Freq: Two times a day (BID) | ORAL | 0 refills | Status: DC

## 2016-04-24 MED ORDER — FLUCONAZOLE 150 MG PO TABS: 150.0000 mg | ORAL_TABLET | Freq: Once | ORAL | 0 refills | Status: AC

## 2016-04-24 NOTE — Progress Notes (Addendum)
Name: Robin Hammond   MRN: [id number]    DOB: 05-Jan-1976   Date:04/24/2016       Progress Note  Subjective  Chief Complaint  Chief Complaint  Patient presents with  . Vaginal Bleeding    "wiping blood, haven't had a period since April"    Vaginal Bleeding  Pertinent negatives include no abdominal pain, back pain, chills, constipation, diarrhea, dysuria, fever, frequency, headaches, hematuria, joint pain, nausea, rash, sore throat or urgency.    No problem-specific Assessment & Plan notes found for this encounter.   Past Medical History:  Diagnosis Date  . Depression   . Diabetes mellitus without complication (HCC)   . GERD (gastroesophageal reflux disease)   . Hemorrhoids   . Low serum vitamin D   . Obesity   . Sleep apnea     Past Surgical History:  Procedure Laterality Date  . BREAST REDUCTION SURGERY    . COLONOSCOPY WITH PROPOFOL N/A 01/14/2016   Procedure: COLONOSCOPY WITH PROPOFOL;  Surgeon: Midge Minium, MD;  Location: [facility] ENDOSCOPY;  Service: Endoscopy;  Laterality: N/A;  . TONSILLECTOMY      Family History  Problem Relation Age of Onset  . Diabetes Mother   . Hypertension Mother   . Cancer Maternal Grandmother   . Cancer Paternal Grandmother   . Cancer Paternal Grandfather     Social History   Social History  . Marital status: Single    Spouse name: N/A  . Number of children: N/A  . Years of education: N/A   Occupational History  . Not on file.   Social History Main Topics  . Smoking status: Current Every Day Smoker    Packs/day: 0.30    Types: Cigarettes  . Smokeless tobacco: Never Used  . Alcohol use 1.8 - 2.4 oz/week    3 - 4 Standard drinks or equivalent per week  . Drug use: No  . Sexual activity: Not Currently   Other Topics Concern  . Not on file   Social History Narrative  . No narrative on file    Allergies  Allergen Reactions  . Hydrocodone      Review of Systems  Constitutional: Negative for chills, fever,  malaise/fatigue and weight loss.  HENT: Negative for ear discharge, ear pain and sore throat.   Eyes: Negative for blurred vision.  Respiratory: Negative for cough, sputum production, shortness of breath and wheezing.   Cardiovascular: Negative for chest pain, palpitations and leg swelling.  Gastrointestinal: Negative for abdominal pain, blood in stool, constipation, diarrhea, heartburn, melena and nausea.  Genitourinary: Positive for vaginal bleeding. Negative for dysuria, frequency, hematuria and urgency.  Musculoskeletal: Negative for back pain, joint pain, myalgias and neck pain.  Skin: Negative for rash.  Neurological: Negative for dizziness, tingling, sensory change, focal weakness and headaches.  Endo/Heme/Allergies: Negative for environmental allergies and polydipsia. Does not bruise/bleed easily.  Psychiatric/Behavioral: Negative for depression and suicidal ideas. The patient is not nervous/anxious and does not have insomnia.      Objective  Vitals:   04/24/16 1500  Pulse: 80  Weight: (!) 323 lb (146.5 kg)  Height:  (1.626 m)    Physical Exam  Constitutional: She is well-developed, well-nourished, and in no distress. No distress.  HENT:  Head: Normocephalic and atraumatic.  Right Ear: External ear normal.  Left Ear: External ear normal.  Nose: Nose normal.  Mouth/Throat: Oropharynx is clear and moist.  Eyes: Conjunctivae and EOM are normal. Pupils are equal, round, and reactive to light.  Right eye exhibits no discharge. Left eye exhibits no discharge.  Neck: Normal range of motion. Neck supple. No JVD present. No thyromegaly present.  Cardiovascular: Normal rate, regular rhythm, normal heart sounds and intact distal pulses.  Exam reveals no gallop and no friction rub.   No murmur heard. Pulmonary/Chest: Effort normal and breath sounds normal.  Abdominal: Soft. Bowel sounds are normal. She exhibits no mass. There is no tenderness. There is no guarding.   Genitourinary: Vagina normal. Vulva exhibits erythema and tenderness. Vulva exhibits no exudate, no lesion and no rash. Vagina exhibits normal mucosa, no exudate and no lesion. No vaginal discharge found.  Genitourinary Comments: Speculum exam normal  Musculoskeletal: Normal range of motion. She exhibits no edema.  Lymphadenopathy:    She has no cervical adenopathy.  Neurological: She is alert. She has normal reflexes.  Skin: Skin is warm and dry. She is not diaphoretic.  Psychiatric: Mood and affect normal.      Assessment & Plan  Problem List Items Addressed This Visit    None    Visit Diagnoses    Vulvovaginal discomfort    -  Primary   been in wet bathing suit pasy week/to er if worsen   Relevant Medications   fluconazole (DIFLUCAN) 150 MG tablet   sulfamethoxazole-trimethoprim (BACTRIM DS,SEPTRA DS) 800-160 MG tablet        Dr. Hayden [facility]  04/24/16

## 2016-04-28 ENCOUNTER — Ambulatory Visit
Admission: EM | Admit: 2016-04-28 | Discharge: 2016-04-28 | Discharge status: Against Medical Advice | Payer: BLUE CROSS/BLUE SHIELD | Attending: Family Medicine | Admitting: Family Medicine

## 2016-04-28 DIAGNOSIS — N898 Other specified noninflammatory disorders of vagina: Secondary | ICD-10-CM

## 2016-04-28 DIAGNOSIS — T887XXA Unspecified adverse effect of drug or medicament, initial encounter: Secondary | ICD-10-CM | POA: Diagnosis not present

## 2016-04-28 DIAGNOSIS — T50905A Adverse effect of unspecified drugs, medicaments and biological substances, initial encounter: Secondary | ICD-10-CM

## 2016-04-28 HISTORY — DX: Anxiety disorder, unspecified: F41.9

## 2016-04-28 LAB — WET PREP, GENITAL
Clue Cells Wet Prep HPF POC: NONE SEEN
Sperm: NONE SEEN
Trich, Wet Prep: NONE SEEN
Yeast Wet Prep HPF POC: NONE SEEN

## 2016-04-28 LAB — GLUCOSE, CAPILLARY: Glucose-Capillary: 88 mg/dL (ref 65–99)

## 2016-04-28 LAB — URINALYSIS COMPLETE WITH MICROSCOPIC (ARMC ONLY)
Bacteria, UA: NONE SEEN
Bilirubin Urine: NEGATIVE
Glucose, UA: NEGATIVE mg/dL
Hgb urine dipstick: NEGATIVE
Ketones, ur: NEGATIVE mg/dL
Leukocytes, UA: NEGATIVE
Nitrite: NEGATIVE
Protein, ur: NEGATIVE mg/dL
RBC / HPF: NONE SEEN RBC/hpf (ref 0–5)
Specific Gravity, Urine: 1.02 (ref 1.005–1.030)
pH: 5.5 (ref 5.0–8.0)

## 2016-04-28 NOTE — ED Triage Notes (Signed)
Patient was seen yesterday for a yeast infection and took bactrim and she feels like she has had a reaction to the medication (Bactrim).Patient has a new diagnosis of Diabetes from earlier in the year. Patient seems very anxious and worried because she didn't feel like Dr. Yetta Barre didn't take the time to listen to her and really figure out what is best for her. S/S today, dizzy, abdomen pain (acidy), she has been taken her acid reflux medication. She drank OJ in waiting room and she does feels somewhat better. She has been diagnosis with anxiety and depression but doesn't take her medication because of cost.

## 2016-04-28 NOTE — ED Provider Notes (Signed)
CSN: [id number]     Arrival date & time 04/28/16  1117 History   First MD Initiated Contact with Patient 04/28/16 1335     Chief Complaint  Patient presents with  . Medication Reaction    Antibiotic    (Consider location/radiation/quality/duration/timing/severity/associated sxs/prior Treatment) HPI  This a 40 year old female who presents complaints of possible side effects to medication. He states that she was seen approximate 4 days ago complaining of a vaginal discharge and vaginal irritation. She states the provider at that time had done Examination of her vaginal area stated he did not see any fissures or external problems. She was upset because no swabs were taken. Evidently she was treated for a possible yeast infection with Diflucan but despite this continued to complain of discomfort. The provider then prescribed her  Bactrim DS and after 2 doses she noticed that she was having severe reflux type symptoms as continued its use. She is here today because of the reaction to her medications as well as her continued vaginal discharge and her dizziness and weakness which she believes may be from diabetes. She states that she would prefer to do a self wet prep having undergone a pelvic of 4 days prior. Told her that if she does perform that herself , we can only tell from the wet prep and not from the physical exam. . However she is adamant that she would prefer to do self administered test herself. She states that she is not sexually active and is not concerned regarding STDs Past Medical History:  Diagnosis Date  . Anxiety   . Depression   . Diabetes mellitus without complication (HCC)   . GERD (gastroesophageal reflux disease)   . Hemorrhoids   . Low serum vitamin D   . Obesity   . Sleep apnea    Past Surgical History:  Procedure Laterality Date  . BREAST REDUCTION SURGERY    . COLONOSCOPY WITH PROPOFOL N/A 01/14/2016   Procedure: COLONOSCOPY WITH PROPOFOL;  Surgeon: Midge Minium, MD;   Location: [facility] ENDOSCOPY;  Service: Endoscopy;  Laterality: N/A;  . TONSILLECTOMY     Family History  Problem Relation Age of Onset  . Diabetes Mother   . Hypertension Mother   . Cancer Maternal Grandmother   . Cancer Paternal Grandmother   . Cancer Paternal Grandfather    Social History  Substance Use Topics  . Smoking status: Current Every Day Smoker    Packs/day: 0.30    Types: Cigarettes  . Smokeless tobacco: Never Used  . Alcohol use 1.8 - 2.4 oz/week    3 - 4 Standard drinks or equivalent per week   OB History    No data available     Review of Systems  Constitutional: Positive for activity change. Negative for chills, fatigue and fever.  Genitourinary: Positive for dysuria, frequency and vaginal discharge.  Neurological: Positive for dizziness, weakness and headaches.  All other systems reviewed and are negative.   Allergies  Hydrocodone  Home Medications   Prior to Admission medications   Medication Sig Start Date End Date Taking? Authorizing Provider  Cholecalciferol (VITAMIN D) 2000 units CAPS Take 1 capsule (2,000 Units total) by mouth daily. Patient not taking: Reported on 12/04/2015 12/03/15   Schuyler Amor, MD  citalopram (CELEXA) 20 MG tablet Take 1 tablet (20 mg total) by mouth daily. Patient not taking: Reported on 04/24/2016 12/25/15   Schuyler Amor, MD  dicyclomine (BENTYL) 20 MG tablet Take 1 tablet (20 mg total) by mouth 3 (  three) times daily before meals. Patient not taking: Reported on 12/31/2015 12/04/15   Midge Minium, MD  metFORMIN (GLUCOPHAGE) 500 MG tablet Take 1 tablet (500 mg total) by mouth 2 (two) times daily with a meal. Patient not taking: Reported on 12/31/2015 12/03/15   Schuyler Amor, MD  naproxen (NAPROSYN) 500 MG tablet Take 500 mg by mouth 2 (two) times daily as needed.    Historical Provider, MD  pantoprazole (PROTONIX) 40 MG tablet Take 1 tablet (40 mg total) by mouth daily before breakfast. Patient not taking: Reported on 04/24/2016 12/04/15    Midge Minium, MD  sulfamethoxazole-trimethoprim (BACTRIM DS,SEPTRA DS) 800-160 MG tablet Take 1 tablet by mouth 2 (two) times daily. 04/24/16   Duanne Limerick, MD  vitamin B-12 (CYANOCOBALAMIN) 1000 MCG tablet Take 1 tablet (1,000 mcg total) by mouth daily. Patient not taking: Reported on 04/24/2016 12/03/15   Schuyler Amor, MD   Meds Ordered and Administered this Visit  Medications - No data to display  BP 122/68 (BP Location: Left Arm)   Pulse 74   Temp 98 F (36.7 C) (Oral)   Resp 20   Ht 5\' 4"  (1.626 m)   Wt (!) 323 lb (146.5 kg)   LMP 12/28/2015 (Approximate) Comment: irregular periods "all my life"  SpO2 100%   BMI 55.44 kg/m  No data found.   Physical Exam  Constitutional: She is oriented to person, place, and time. She appears well-developed and well-nourished. No distress.  HENT:  Head: Normocephalic and atraumatic.  Eyes: EOM are normal. Pupils are equal, round, and reactive to light.  Neck: Normal range of motion. Neck supple.  Genitourinary:  Genitourinary Comments: Patient refused pelvic examination  Musculoskeletal: Normal range of motion. She exhibits no edema, tenderness or deformity.  Neurological: She is alert and oriented to person, place, and time.  Skin: Skin is warm and dry. She is not diaphoretic.  Psychiatric: She has a normal mood and affect. Her behavior is normal. Judgment and thought content normal.  Nursing note and vitals reviewed.   Urgent Care Course   Clinical Course    Procedures (including critical care time)  Labs Review Labs Reviewed  WET PREP, GENITAL - Abnormal; Notable for the following:       Result Value   WBC, Wet Prep HPF POC FEW (*)    All other components within normal limits  URINALYSIS COMPLETEWITH MICROSCOPIC ([facility] ONLY) - Abnormal; Notable for the following:    Squamous Epithelial / LPF 0-5 (*)    All other components within normal limits  GLUCOSE, CAPILLARY  CBG MONITORING, ED    Imaging Review No results  found.   Visual Acuity Review  Right Eye Distance:   Left Eye Distance:   Bilateral Distance:    Right Eye Near:   Left Eye Near:    Bilateral Near:         MDM  No diagnosis found. Discharge Medication List as of 04/28/2016  2:19 PM    Plan: 1. Test/x-ray results and diagnosis reviewed with patient 2. rx as per orders; risks, benefits, potential side effects reviewed with patient 3. Recommend supportive treatment with Discontinuing the Bactrim because of the side effects of gastric irritation. I recommended that she contact the provider who prescribed it they may change it to a different antibiotic if they feel that is necessary. We will notify the patient regarding her self obtained wet prep did not show any abnormalities other than some increased white count.I cannot comment on any  further due to her not allowing a pelvic examination to be performed. Again she should follow-up with her primary care for further instructions. Her CBG today was 47 does not show any evidence of diabetes mellitus and she should discuss this also with her primary care if this is a concern for her. 4. F/u prn if symptoms worsen or don't improve     Lutricia Feil, PA-C 04/28/16 1527 The patient was notified of the results of her laboratory. They were all normal  her blood glucose today was 88. He is advised to follow-up with her primary care physician to discuss her concerns and relay our findings to them.   Lutricia Feil, PA-C 04/28/16 2109    Lutricia Feil, PA-C 04/28/16 2113

## 2016-04-28 NOTE — ED Notes (Signed)
Spoke with Robin Hammond today and updated her on her test results and that [name], PA suggested to stop the Bactrim and to follow up with her PCP as needed.

## 2017-08-17 ENCOUNTER — Ambulatory Visit
Admission: EM | Admit: 2017-08-17 | Discharge: 2017-08-17 | Disposition: A | Discharge status: Home / Self Care | Payer: BLUE CROSS/BLUE SHIELD | Attending: Family Medicine | Admitting: Family Medicine

## 2017-08-17 ENCOUNTER — Other Ambulatory Visit: Payer: Self-pay

## 2017-08-17 ENCOUNTER — Encounter: Payer: Self-pay | Admitting: Emergency Medicine

## 2017-08-17 DIAGNOSIS — N898 Other specified noninflammatory disorders of vagina: Secondary | ICD-10-CM | POA: Diagnosis not present

## 2017-08-17 DIAGNOSIS — R109 Unspecified abdominal pain: Secondary | ICD-10-CM | POA: Diagnosis not present

## 2017-08-17 DIAGNOSIS — M549 Dorsalgia, unspecified: Secondary | ICD-10-CM | POA: Diagnosis not present

## 2017-08-17 LAB — URINALYSIS, COMPLETE (UACMP) WITH MICROSCOPIC
GLUCOSE, UA: NEGATIVE mg/dL
Hgb urine dipstick: NEGATIVE
KETONES UR: NEGATIVE mg/dL
LEUKOCYTES UA: NEGATIVE
Nitrite: NEGATIVE
PROTEIN: NEGATIVE mg/dL
RBC / HPF: NONE SEEN RBC/hpf (ref 0–5)
Specific Gravity, Urine: 1.025 (ref 1.005–1.030)
pH: 6 (ref 5.0–8.0)

## 2017-08-17 LAB — WET PREP, GENITAL
CLUE CELLS WET PREP: NONE SEEN
Sperm: NONE SEEN
TRICH WET PREP: NONE SEEN
YEAST WET PREP: NONE SEEN

## 2017-08-17 MED ORDER — DICLOFENAC SODIUM 75 MG PO TBEC: 75.0000 mg | DELAYED_RELEASE_TABLET | Freq: Two times a day (BID) | ORAL | 0 refills | Status: DC | PRN

## 2017-08-17 NOTE — ED Triage Notes (Signed)
Patient in today c/o flank pain x 1 day. Patient states that she thought she had a yeast infection and has been using Monistat for the last 3 days. Patient denies urinary frequency and no dysuria today, but did have dysuria initially 3 days ago. Patient denies fever, but hasn't taken temperature. Patient has been taking Motrin as well.

## 2017-08-17 NOTE — ED Provider Notes (Signed)
[facility]    CSN: [id number] Arrival date & time: 08/17/17  0803     History   Chief Complaint Chief Complaint  Patient presents with  . Flank Pain   HPI  41 year old female presents with the above complaint.  Patient reports that yesterday she developed left back/flank pain.  She reports that she had a difficulty ambulating secondary to the pain.  Pain was severe.  Pain is currently 4/10 in severity.  She states that she recently had some dysuria but has no urinary symptoms currently.  No fall, trauma, injury.  No associated fevers or chills.  She states that she recently treated herself for a yeast infection after developing dysuria.  Reports vaginal discharge. No known exacerbating or relieving factors.  No other associated symptoms.  No other complaints at this time.  Past Medical History:  Diagnosis Date  . Anxiety   . Depression   . Diabetes mellitus without complication (HCC)   . GERD (gastroesophageal reflux disease)   . Hemorrhoids   . Low serum vitamin D   . Obesity   . Sleep apnea    Patient Active Problem List   Diagnosis Date Noted  . Noninfectious diarrhea   . Diabetes mellitus type 2, controlled, without complications (HCC) 12/03/2015  . Vitamin D deficiency 12/03/2015  . Vitamin B12 deficiency 12/03/2015  . Obesity, morbid, BMI 50 or higher (HCC) 12/02/2015  . Depression 12/02/2015  . FH: diabetes mellitus 12/02/2015  . Bilateral low back pain without sciatica 12/02/2015  . Right knee pain 12/02/2015  . Fecal incontinence 12/02/2015   Past Surgical History:  Procedure Laterality Date  . BREAST REDUCTION SURGERY    . COLONOSCOPY WITH PROPOFOL N/A 01/14/2016   Performed by Midge MiniumWohl, Darren, MD at [facility]  . TONSILLECTOMY     OB History    No data available     Home Medications    Prior to Admission medications   Medication Sig Start Date End Date Taking? Authorizing Provider  diclofenac (VOLTAREN) 75 MG EC tablet Take 1 tablet  (75 mg total) 2 (two) times daily as needed by mouth for moderate pain. 08/17/17   Tommie Samsook, Woodfin Kiss G, DO   Family History Family History  Problem Relation Age of Onset  . Diabetes Mother   . Hypertension Mother   . Cancer Maternal Grandmother   . Cancer Paternal Grandmother   . Cancer Paternal Grandfather    Social History Social History   Tobacco Use  . Smoking status: Current Every Day Smoker    Packs/day: 0.30    Types: Cigarettes  . Smokeless tobacco: Never Used  Substance Use Topics  . Alcohol use: Yes    Alcohol/week: 1.8 - 2.4 oz    Types: 3 - 4 Standard drinks or equivalent per week  . Drug use: No    Allergies   Hydrocodone  Review of Systems Review of Systems  Constitutional: Negative for fever.  Genitourinary: Positive for flank pain.       Recent dysuria but none currently.  Musculoskeletal: Positive for back pain.    Physical Exam Triage Vital Signs ED Triage Vitals  Enc Vitals Group     BP 08/17/17 0826 122/66     Pulse Rate 08/17/17 0826 71     Resp 08/17/17 0826 16     Temp 08/17/17 0826 98.7 F (37.1 C)     Temp Source 08/17/17 0826 Oral     SpO2 08/17/17 0826 99 %  Weight 08/17/17 0826 (!) 304 lb 3.2 oz (138 kg)     Height 08/17/17 0826 5\' 5"  (1.651 m)     Head Circumference --      Peak Flow --      Pain Score 08/17/17 0827 4     Pain Loc --      Pain Edu? --      Excl. in GC? --    Updated Vital Signs BP 122/66 (BP Location: Left Arm)   Pulse 71   Temp 98.7 F (37.1 C) (Oral)   Resp 16   Ht 5\' 5"  (1.651 m)   Wt (!) 304 lb 3.2 oz (138 kg)   LMP 07/12/2017 (Approximate)   SpO2 99%   BMI 50.62 kg/m     Physical Exam  Constitutional: She is oriented to person, place, and time. She appears well-developed. No distress.  Morbidly obese.  Eyes: Conjunctivae are normal. No scleral icterus.  Cardiovascular: Normal rate and regular rhythm.  No murmur heard. Pulmonary/Chest: Effort normal and breath sounds normal. No respiratory  distress. She has no wheezes. She has no rales.  Abdominal: Soft. She exhibits no distension. There is no tenderness. There is no rebound and no guarding.  Neurological: She is alert and oriented to person, place, and time.  Skin: Skin is warm. No rash noted.  Psychiatric: She has a normal mood and affect. Her behavior is normal.  Vitals reviewed.  UC Treatments / Results  Labs (all labs ordered are listed, but only abnormal results are displayed) Labs Reviewed  WET PREP, GENITAL - Abnormal; Notable for the following components:      Result Value   WBC, Wet Prep HPF POC FEW (*)    All other components within normal limits  URINALYSIS, COMPLETE (UACMP) WITH MICROSCOPIC - Abnormal; Notable for the following components:   Bilirubin Urine SMALL (*)    Squamous Epithelial / LPF 6-30 (*)    Bacteria, UA RARE (*)    All other components within normal limits    EKG  EKG Interpretation None       Radiology No results found.  Procedures Procedures (including critical care time)  Medications Ordered in UC Medications - No data to display   Initial Impression / Assessment and Plan / UC Course  I have reviewed the triage vital signs and the nursing notes.  Pertinent labs & imaging results that were available during my care of the patient were reviewed by me and considered in my medical decision making (see chart for details).     41 year old female presents with back/flank pain.  Also reporting vaginal discharge.  Wet prep negative.  No evidence of UTI.  Likely MSK.  Treating with diclofenac.  Final Clinical Impressions(s) / UC Diagnoses   Final diagnoses:  Left flank pain    ED Discharge Orders        Ordered    diclofenac (VOLTAREN) 75 MG EC tablet  2 times daily PRN     08/17/17 0850     Controlled Substance Prescriptions [place] Controlled Substance Registry consulted? Not Applicable   Tommie SamsCook, Elven Laboy G, DO 08/17/17 [id number]

## 2017-08-17 NOTE — Discharge Instructions (Addendum)
No evidence of UTI.  This is likely musculoskeletal as it was worse with activity. No yeast or infection was seen on your wet prep.  Use the medication as needed.  Follow up with your PCP.   Take care  Dr. Adriana Simasook

## 2018-10-29 ENCOUNTER — Other Ambulatory Visit: Payer: Self-pay

## 2018-10-29 ENCOUNTER — Ambulatory Visit
Admission: EM | Admit: 2018-10-29 | Discharge: 2018-10-29 | Disposition: A | Discharge status: Home / Self Care | Payer: Managed Care, Other (non HMO) | Attending: Family Medicine | Admitting: Family Medicine

## 2018-10-29 ENCOUNTER — Encounter: Payer: Self-pay | Admitting: Gynecology

## 2018-10-29 DIAGNOSIS — R1030 Lower abdominal pain, unspecified: Secondary | ICD-10-CM | POA: Diagnosis not present

## 2018-10-29 DIAGNOSIS — R112 Nausea with vomiting, unspecified: Secondary | ICD-10-CM | POA: Diagnosis not present

## 2018-10-29 MED ORDER — ONDANSETRON HCL 4 MG PO TABS: 4.0000 mg | ORAL_TABLET | Freq: Three times a day (TID) | ORAL | 0 refills | Status: DC | PRN

## 2018-10-29 MED ORDER — MELOXICAM 15 MG PO TABS: 15.0000 mg | ORAL_TABLET | Freq: Every day | ORAL | 0 refills | Status: DC | PRN

## 2018-10-29 MED ORDER — ONDANSETRON 8 MG PO TBDP
8.0000 mg | ORAL_TABLET | Freq: Once | ORAL | Status: AC
  Administered 2018-10-29: 8 mg via ORAL

## 2018-10-29 NOTE — ED Provider Notes (Signed)
[facility]    CSN: [id number] Arrival date & time: 10/29/18  1104  History   Chief Complaint Vomiting, Abdominal pain  HPI  43 year old morbidly obese female presents with the above complaints.  Patient was seen by her OB/GYN yesterday for abnormal uterine bleeding and dysmenorrhea.  Patient states that she has had vomiting as of today.  She states that she has vomited 5-7 times.  Denies diarrhea.  She reports that her "uterus hurts".  Patient states that she feels like she is getting ready to start her menstrual cycle  Patient states that she has been unable to keep anything down.  No other associated symptoms.  No other complaints or concerns.  PMH, Surgical Hx, Family Hx, Social History reviewed and updated as below.  Past Medical History:  Diagnosis Date  . Anxiety   . Depression   . Diabetes mellitus without complication (HCC)   . GERD (gastroesophageal reflux disease)   . Hemorrhoids   . Low serum vitamin D   . Obesity   . Sleep apnea     Patient Active Problem List   Diagnosis Date Noted  . Noninfectious diarrhea   . Diabetes mellitus type 2, controlled, without complications (HCC) 12/03/2015  . Vitamin D deficiency 12/03/2015  . Vitamin B12 deficiency 12/03/2015  . Obesity, morbid, BMI 50 or higher (HCC) 12/02/2015  . Depression 12/02/2015  . FH: diabetes mellitus 12/02/2015  . Bilateral low back pain without sciatica 12/02/2015  . Right knee pain 12/02/2015  . Fecal incontinence 12/02/2015    Past Surgical History:  Procedure Laterality Date  . BREAST REDUCTION SURGERY    . COLONOSCOPY WITH PROPOFOL N/A 01/14/2016   Procedure: COLONOSCOPY WITH PROPOFOL;  Surgeon: Midge Minium, MD;  Location: [facility] ENDOSCOPY;  Service: Endoscopy;  Laterality: N/A;  . TONSILLECTOMY      OB History   No obstetric history on file.    Home Medications    Prior to Admission medications   Medication Sig Start Date End Date Taking? Authorizing Provider  ASPIRIN 81  PO Take by mouth.   Yes [provider]  B Complex-Folic Acid (B COMPLEX-VITAMIN B12 PO) B Complex-Vitamin B12   Yes [provider]  citalopram (CELEXA) 20 MG tablet citalopram 20 mg tablet   Yes [provider]  medroxyPROGESTERone (PROVERA) 10 MG tablet  10/28/18  Yes [provider]  metFORMIN (GLUCOPHAGE) 500 MG tablet metformin 500 mg tablet   Yes [provider]  pantoprazole (PROTONIX) 40 MG tablet pantoprazole 40 mg tablet,delayed release   Yes [provider]  meloxicam (MOBIC) 15 MG tablet Take 1 tablet (15 mg total) by mouth daily as needed for pain. 10/29/18   Tommie Sams, DO  ondansetron (ZOFRAN) 4 MG tablet Take 1 tablet (4 mg total) by mouth every 8 (eight) hours as needed for nausea or vomiting. 10/29/18   Tommie Sams, DO    Family History Family History  Problem Relation Age of Onset  . Diabetes Mother   . Hypertension Mother   . Cancer Maternal Grandmother   . Cancer Paternal Grandmother   . Cancer Paternal Grandfather     Social History Social History   Tobacco Use  . Smoking status: Current Every Day Smoker    Packs/day: 0.30    Types: Cigarettes  . Smokeless tobacco: Never Used  Substance Use Topics  . Alcohol use: Yes    Alcohol/week: 3.0 - 4.0 standard drinks    Types: 3 - 4 Standard drinks  or equivalent per week  . Drug use: No     Allergies   Oxycodone and Hydrocodone   Review of Systems Review of Systems  Constitutional: Negative for fever.  Gastrointestinal: Positive for nausea and vomiting.   Physical Exam Triage Vital Signs ED Triage Vitals  Enc Vitals Group     BP 10/29/18 1134 124/79     Pulse Rate 10/29/18 1134 95     Resp 10/29/18 1134 18     Temp 10/29/18 1134 98 F (36.7 C)     Temp Source 10/29/18 1134 Oral     SpO2 10/29/18 1134 98 %     Weight 10/29/18 1140 (!) 334 lb (151.5 kg)     Height 10/29/18 1140 5\' 4"  (1.626 m)     Head Circumference --      Peak Flow --       Pain Score 10/29/18 1140 0     Pain Loc --      Pain Edu? --      Excl. in GC? --    Updated Vital Signs BP 124/79 (BP Location: Left Arm)   Pulse 95   Temp 98 F (36.7 C) (Oral)   Resp 18   Ht 5\' 4"  (1.626 m)   Wt (!) 151.5 kg   LMP 10/29/2018   SpO2 98%   BMI 57.33 kg/m   Visual Acuity Right Eye Distance:   Left Eye Distance:   Bilateral Distance:    Right Eye Near:   Left Eye Near:    Bilateral Near:     Physical Exam Vitals signs and nursing note reviewed.  Constitutional:      Comments: Morbidly obese.  No acute distress.  HENT:     Head: Normocephalic and atraumatic.     Mouth/Throat:     Mouth: Mucous membranes are moist.     Pharynx: Oropharynx is clear.  Eyes:     General:        Right eye: No discharge.        Left eye: No discharge.     Conjunctiva/sclera: Conjunctivae normal.  Cardiovascular:     Rate and Rhythm: Normal rate and regular rhythm.  Pulmonary:     Effort: Pulmonary effort is normal.     Breath sounds: Normal breath sounds.  Abdominal:     Comments: Soft, no discrete areas of tenderness.  Neurological:     Mental Status: She is alert.  Psychiatric:        Mood and Affect: Mood normal.        Behavior: Behavior normal.    UC Treatments / Results  Labs (all labs ordered are listed, but only abnormal results are displayed) Labs Reviewed - No data to display  EKG None  Radiology No results found.  Procedures Procedures (including critical care time)  Medications Ordered in UC Medications  ondansetron (ZOFRAN-ODT) disintegrating tablet 8 mg (8 mg Oral Given 10/29/18 1212)    Initial Impression / Assessment and Plan / UC Course  I have reviewed the triage vital signs and the nursing notes.  Pertinent labs & imaging results that were available during my care of the patient were reviewed by me and considered in my medical decision making (see chart for details).    43 year old female presents with nausea and vomiting.   Associated lower abdominal pain.  Suspect viral etiology.  Zofran was given here the patient did not tolerate the ODT Zofran.  Sending home on oral Zofran and Meloxicam for pain.  I have advised her that if she is unable to keep things down she should go directly to the ER.  Final Clinical Impressions(s) / UC Diagnoses   Final diagnoses:  Intractable vomiting with nausea, unspecified vomiting type  Lower abdominal pain     Discharge Instructions     Medication as prescribed.  If you continue to have nausea and vomiting, please go to the ER.  Take care  Dr. Adriana Simas    ED Prescriptions    Medication Sig Dispense Auth. Provider   meloxicam (MOBIC) 15 MG tablet Take 1 tablet (15 mg total) by mouth daily as needed for pain. 30 tablet Kaitlynd Phillips G, DO   ondansetron (ZOFRAN) 4 MG tablet Take 1 tablet (4 mg total) by mouth every 8 (eight) hours as needed for nausea or vomiting. 20 tablet Tommie Sams, DO     Controlled Substance Prescriptions [place] Controlled Substance Registry consulted? Not Applicable   Tommie Sams, DO 10/29/18 1724

## 2018-10-29 NOTE — ED Triage Notes (Signed)
Per patient with vomiting x this morning. Per patient mucous emeses. Per patient seen at OB/GYN x yesterday for abdominal pain. Patient stated was prescribe naproxen x yesterday but she has not taken it as yet

## 2018-10-29 NOTE — Discharge Instructions (Signed)
Medication as prescribed.  If you continue to have nausea and vomiting, please go to the ER.  Take care  Dr. Adriana Simas

## 2018-12-18 ENCOUNTER — Other Ambulatory Visit: Payer: Self-pay | Admitting: Family Medicine

## 2020-01-19 ENCOUNTER — Ambulatory Visit
Admission: RE | Admit: 2020-01-19 | Discharge: 2020-01-19 | Disposition: A | Discharge status: Home / Self Care | Payer: Managed Care, Other (non HMO) | Source: Ambulatory Visit | Attending: Medical Oncology | Admitting: Medical Oncology

## 2020-01-19 ENCOUNTER — Other Ambulatory Visit: Payer: Self-pay | Admitting: Medical Oncology

## 2020-01-19 DIAGNOSIS — M545 Low back pain, unspecified: Secondary | ICD-10-CM

## 2021-12-01 IMAGING — CR DG LUMBAR SPINE 2-3V
1 series · 3 of 3 positions shown · non-contrast
Comparison: None.

CLINICAL DATA: Right-sided lower back pain and right hip pain.

EXAM:
LUMBAR SPINE - 2-3 VIEW

[Series 1: dg lumbar spine 2-3 views · 0.14mm/px · 3 of 3 slices shown]
[im 1/3]
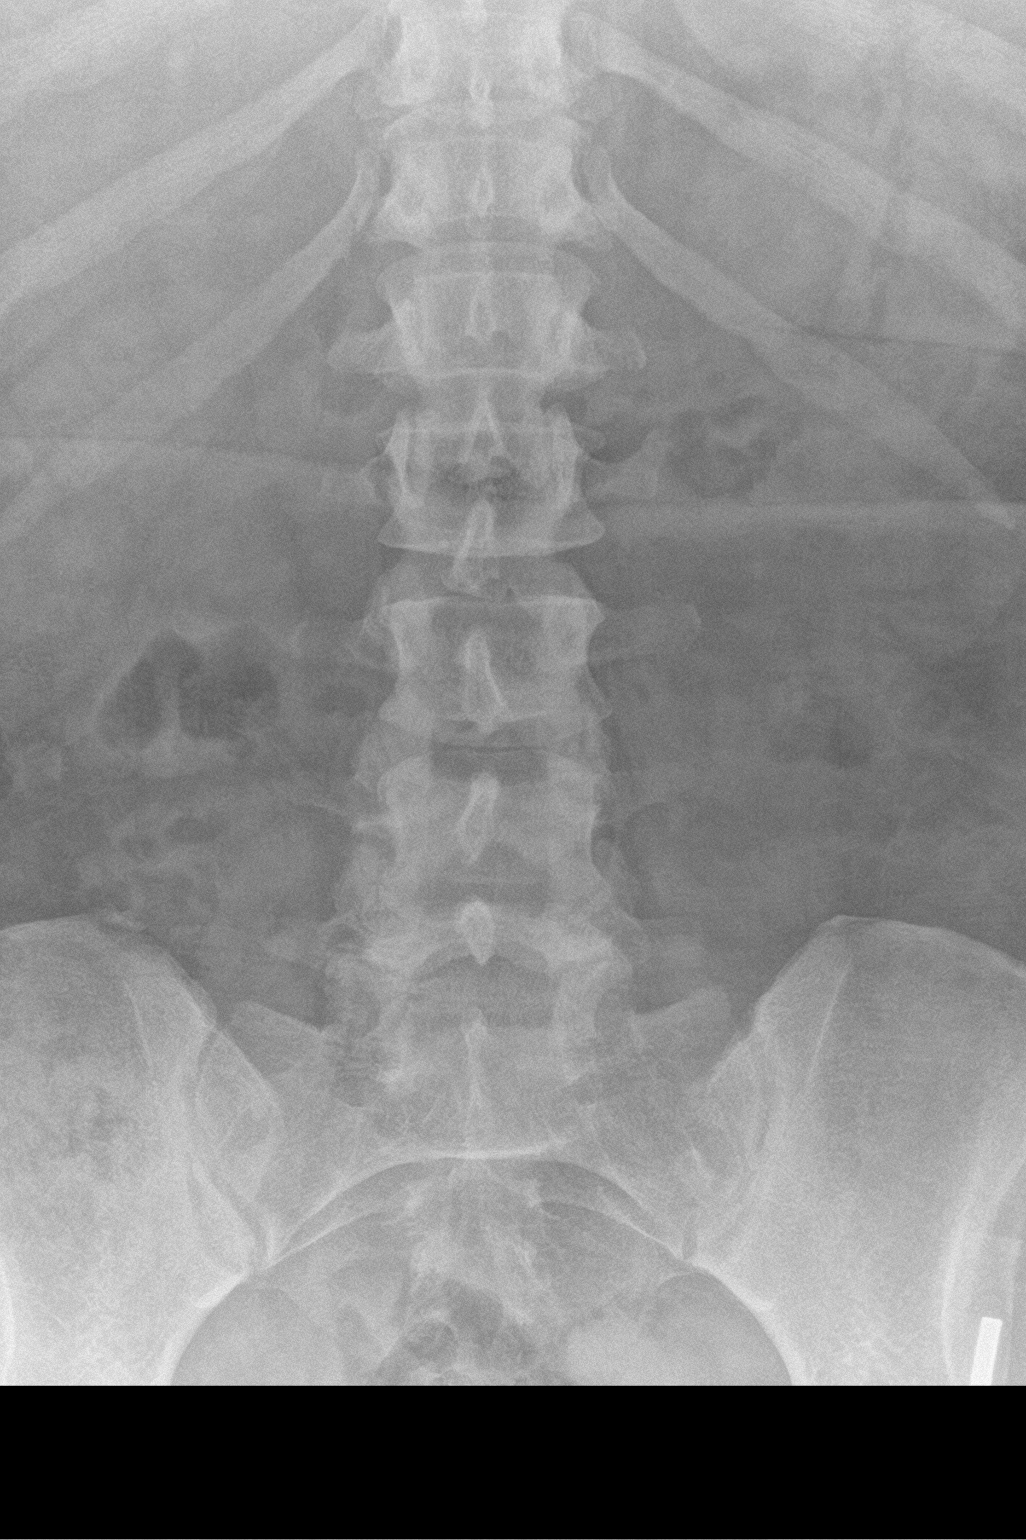
[im 2/3]
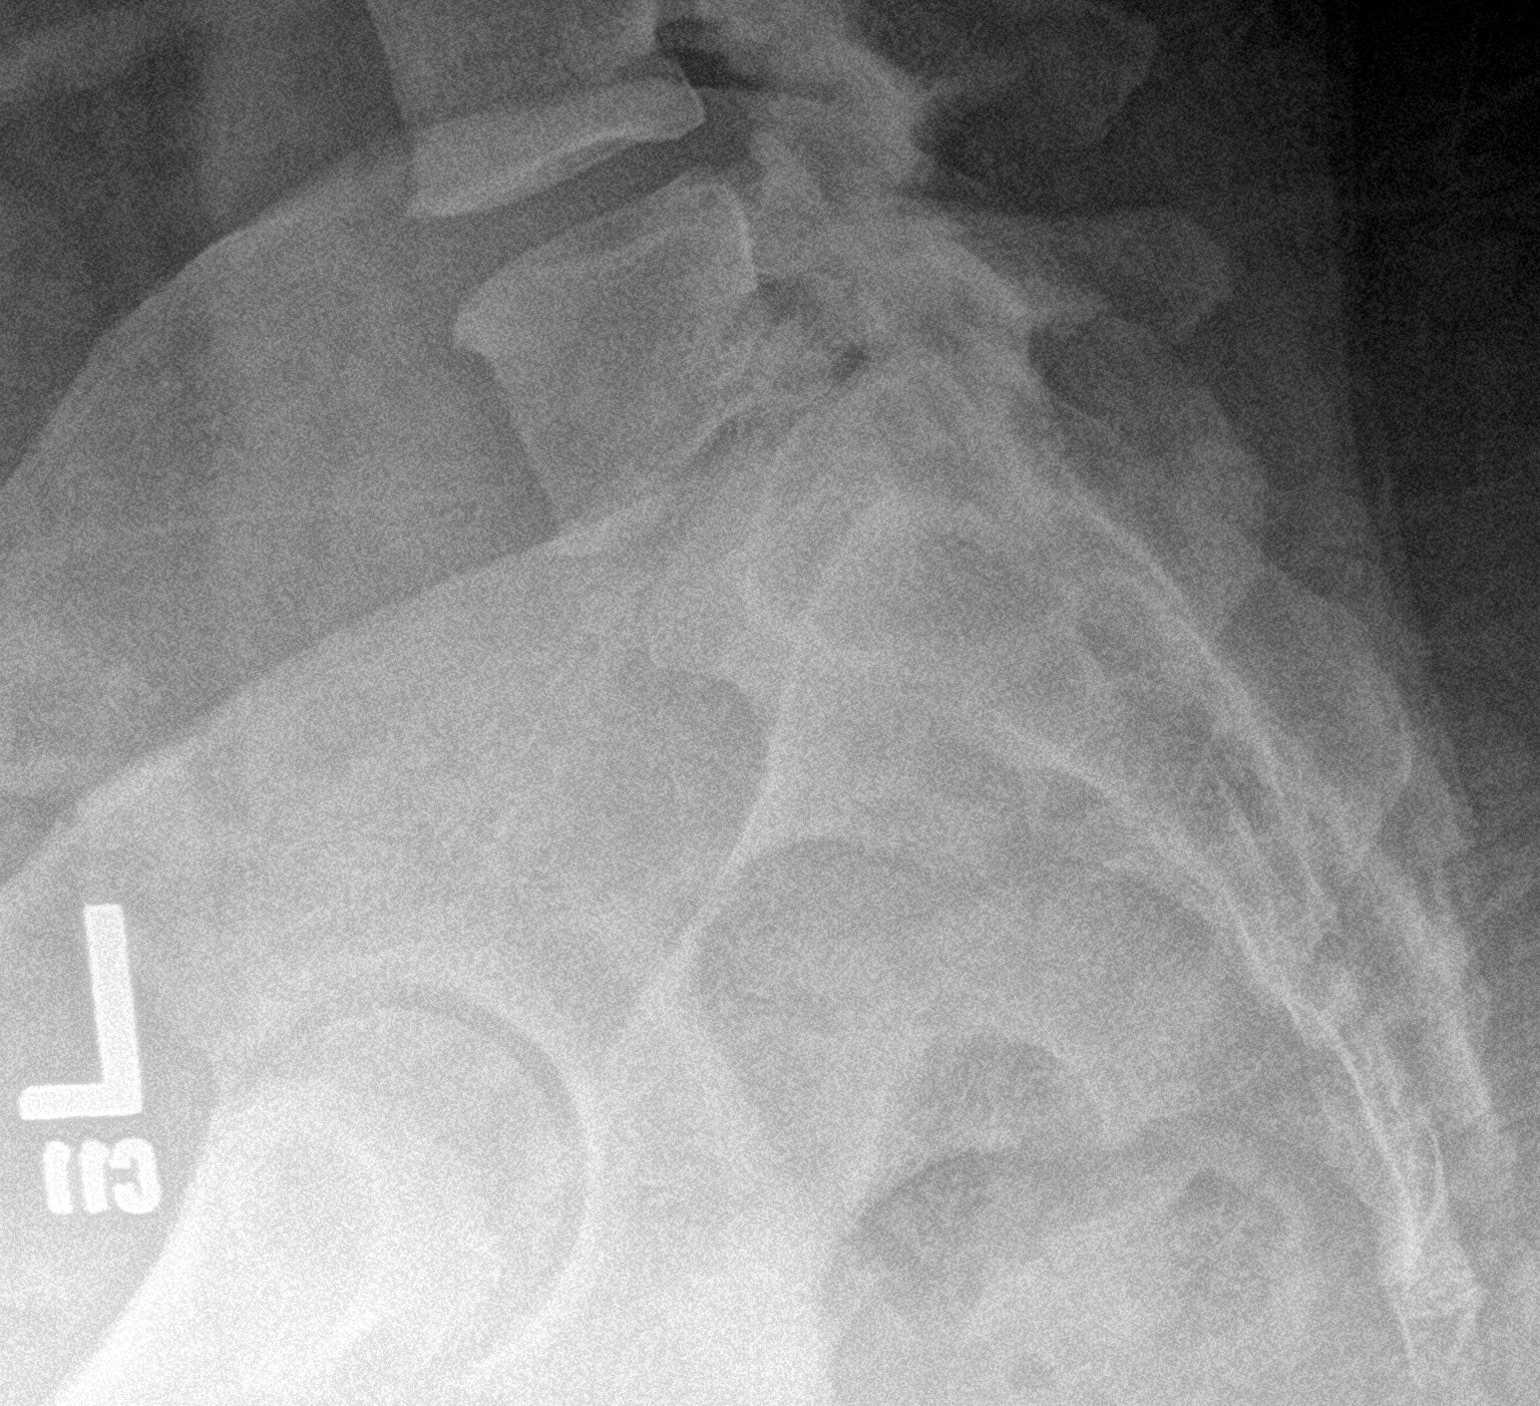
[im 3/3]
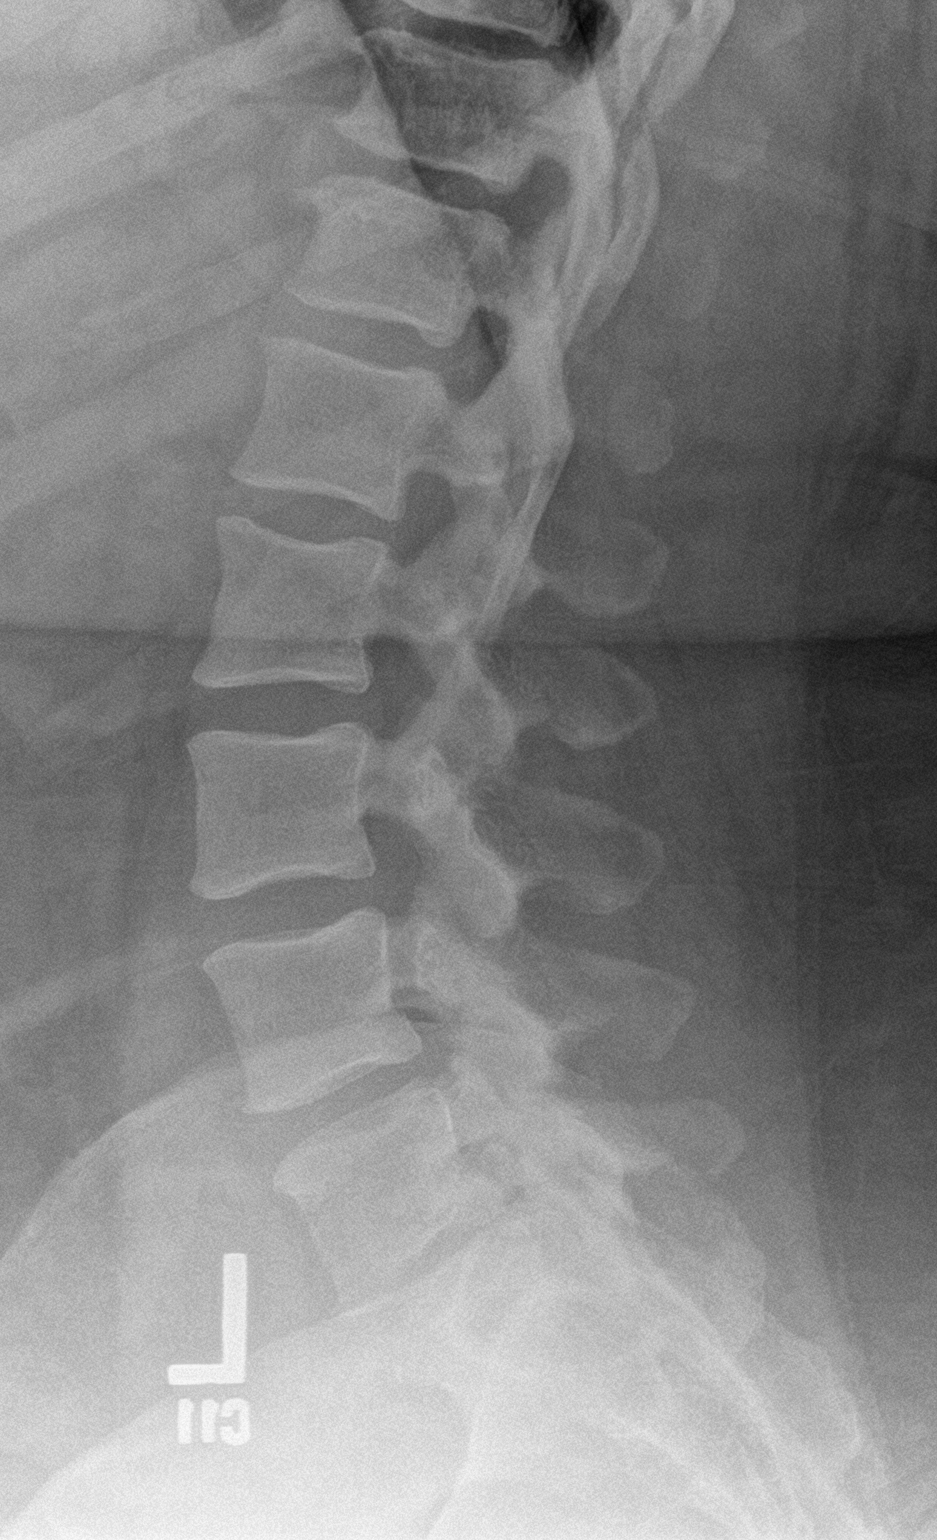

[3 of 3 positions shown; findings below may reference images not displayed]

FINDINGS: There is no evidence of lumbar spine fracture. Alignment is normal.
Very early anterior osteophyte formation is seen at the levels of
L4-L5 and L5-S1. Intervertebral disc spaces are maintained.
IMPRESSION: Very early anterior osteophyte formation at L4-L5 and L5-S1.

## 2022-09-17 ENCOUNTER — Other Ambulatory Visit: Payer: Self-pay

## 2022-09-17 ENCOUNTER — Encounter: Payer: Self-pay | Admitting: Emergency Medicine

## 2022-09-17 ENCOUNTER — Emergency Department: Payer: Managed Care, Other (non HMO)

## 2022-09-17 ENCOUNTER — Emergency Department
Admission: EM | Admit: 2022-09-17 | Discharge: 2022-09-17 | Disposition: A | Discharge status: Home / Self Care | Payer: Managed Care, Other (non HMO) | Attending: Emergency Medicine | Admitting: Emergency Medicine

## 2022-09-17 DIAGNOSIS — E119 Type 2 diabetes mellitus without complications: Secondary | ICD-10-CM | POA: Insufficient documentation

## 2022-09-17 DIAGNOSIS — U071 COVID-19: Secondary | ICD-10-CM | POA: Insufficient documentation

## 2022-09-17 DIAGNOSIS — J111 Influenza due to unidentified influenza virus with other respiratory manifestations: Secondary | ICD-10-CM

## 2022-09-17 DIAGNOSIS — J101 Influenza due to other identified influenza virus with other respiratory manifestations: Secondary | ICD-10-CM | POA: Insufficient documentation

## 2022-09-17 DIAGNOSIS — J45909 Unspecified asthma, uncomplicated: Secondary | ICD-10-CM | POA: Insufficient documentation

## 2022-09-17 LAB — CBC
HCT: 41 % (ref 36.0–46.0)
Hemoglobin: 13.3 g/dL (ref 12.0–15.0)
MCH: 30 pg (ref 26.0–34.0)
MCHC: 32.4 g/dL (ref 30.0–36.0)
MCV: 92.6 fL (ref 80.0–100.0)
Platelets: 273 10*3/uL (ref 150–400)
RBC: 4.43 MIL/uL (ref 3.87–5.11)
RDW: 13.8 % (ref 11.5–15.5)
WBC: 7.6 10*3/uL (ref 4.0–10.5)
nRBC: 0 % (ref 0.0–0.2)

## 2022-09-17 LAB — BASIC METABOLIC PANEL
Anion gap: 7 (ref 5–15)
BUN: 7 mg/dL (ref 6–20)
CO2: 25 mmol/L (ref 22–32)
Calcium: 8.8 mg/dL — ABNORMAL LOW (ref 8.9–10.3)
Chloride: 102 mmol/L (ref 98–111)
Creatinine, Ser: 0.73 mg/dL (ref 0.44–1.00)
GFR, Estimated: >60 mL/min (ref >60)
Glucose, Bld: 129 mg/dL — ABNORMAL HIGH (ref 70–99)
Potassium: 3.9 mmol/L (ref 3.5–5.1)
Sodium: 134 mmol/L — ABNORMAL LOW (ref 135–145)

## 2022-09-17 LAB — TROPONIN I (HIGH SENSITIVITY): Troponin I (High Sensitivity): 5 ng/L (ref <18)

## 2022-09-17 MED ORDER — ACETAMINOPHEN 500 MG PO TABS
1000.0000 mg | ORAL_TABLET | Freq: Once | ORAL | Status: AC
  Administered 2022-09-17: 1000 mg via ORAL
  Filled 2022-09-17: qty 2

## 2022-09-17 MED ORDER — PREDNISONE 20 MG PO TABS
60.0000 mg | ORAL_TABLET | Freq: Once | ORAL | Status: AC
  Administered 2022-09-17: 60 mg via ORAL
  Filled 2022-09-17: qty 3

## 2022-09-17 MED ORDER — IBUPROFEN 600 MG PO TABS: 600.0000 mg | ORAL_TABLET | Freq: Four times a day (QID) | ORAL | 0 refills | Status: DC | PRN

## 2022-09-17 MED ORDER — PREDNISONE 20 MG PO TABS: 60.0000 mg | ORAL_TABLET | Freq: Every day | ORAL | 0 refills | Status: AC

## 2022-09-17 MED ORDER — NIRMATRELVIR/RITONAVIR (PAXLOVID)TABLET: 3.0000 | ORAL_TABLET | Freq: Two times a day (BID) | ORAL | 0 refills | Status: AC

## 2022-09-17 MED ORDER — ALBUTEROL SULFATE HFA 108 (90 BASE) MCG/ACT IN AERS: 2.0000 | INHALATION_SPRAY | RESPIRATORY_TRACT | 0 refills | Status: DC | PRN

## 2022-09-17 MED ORDER — IPRATROPIUM-ALBUTEROL 0.5-2.5 (3) MG/3ML IN SOLN
3.0000 mL | Freq: Once | RESPIRATORY_TRACT | Status: AC
  Administered 2022-09-17: 3 mL via RESPIRATORY_TRACT
  Filled 2022-09-17: qty 3

## 2022-09-17 MED ORDER — SPACER/AERO-HOLD CHAMBER BAGS MISC: 0 refills | Status: AC

## 2022-09-17 NOTE — ED Provider Triage Note (Addendum)
Emergency Medicine Provider Triage Evaluation Note  Robin Hammond, a 46 y.o. female  was evaluated in triage.  Pt complains of shortness of breath.  Presents to the ED from [facility] primary care, where she was diagnosed with COVID and flu.  She reports 2 days of cough, congestion, cough, and bodyaches.  Review of Systems  Positive: SOB, fatigue Negative: NVD  Physical Exam  BP (!) 141/84   Pulse (!) 104   Temp (!) 100.6 F (38.1 C) (Oral)   Resp 20   SpO2 96%  Gen:   Awake, no distress  NAD Resp:  Normal effort  MSK:   Moves extremities without difficulty  Other:    Medical Decision Making  Medically screening exam initiated at 1:46 PM.  Appropriate orders placed.  Robin Hammond was informed that the remainder of the evaluation will be completed by another provider, this initial triage assessment does not replace that evaluation, and the importance of remaining in the ED until their evaluation is complete.  Patient to the ED for evaluation of symptoms including shortness of breath, cough, and fatigue.  She was diagnosed with COVID and flu today.   Robin Hoard, PA-C 09/17/22 1347    Robin Hoard, PA-C 09/17/22 1612

## 2022-09-17 NOTE — ED Notes (Signed)
Pt called for repeat EKG inside and out. No reply

## 2022-09-17 NOTE — ED Notes (Signed)
See triage note.  States she is positive for Flu and COVID  Developed some SOB  Hx of asthma but has not had any meds for same  Low grade temp on arrival but is currently afebrile on arrival to room

## 2022-09-17 NOTE — ED Provider Notes (Signed)
[facility] Provider Note    Event Date/Time   First MD Initiated Contact with Patient 09/17/22 1724     (approximate)   History   Shortness of Breath   HPI  Robin Hammond is a 46 y.o. female with a history of morbid obesity, diabetes, asthma, and OSA who presents with cough and shortness of breath for the last 2 days as well as headache, fatigue, and malaise.  The patient went to the walk-in clinic today and tested positive for both COVID and flu.  The patient denies any vomiting or diarrhea.  She has had some fevers at home.  I reviewed the past medical records including outpatient records from family medicine at [facility] today confirming the positive COVID and influenza tests.    Physical Exam   Triage Vital Signs: ED Triage Vitals  Enc Vitals Group     BP 09/17/22 1339 (!) 141/84     Pulse Rate 09/17/22 1339 (!) 104     Resp 09/17/22 1339 20     Temp 09/17/22 1342 (!) 102.5 F (39.2 C)     Temp Source 09/17/22 1342 Oral     SpO2 09/17/22 1343 96 %     Weight 09/17/22 1725 (!) 334 lb (151.5 kg)     Height 09/17/22 1725 5\' 4"  (1.626 m)     Head Circumference --      Peak Flow --      Pain Score 09/17/22 1343 8     Pain Loc --      Pain Edu? --      Excl. in GC? --     Most recent vital signs: Vitals:   09/17/22 1343 09/17/22 1734  BP:  130/80  Pulse:  99  Resp:  20  Temp: (!) 100.6 F (38.1 C) 98.7 F (37.1 C)  SpO2: 96% 96%     General: Awake, no distress.  CV:  Good peripheral perfusion.  Resp:  Normal effort.  Lungs with diminished breath sounds and some scattered wheezes bilaterally. Abd:  No distention.  Other:  Oropharynx with mild erythema but no exudate or swelling.   ED Results / Procedures / Treatments   Labs (all labs ordered are listed, but only abnormal results are displayed) Labs Reviewed  BASIC METABOLIC PANEL - Abnormal; Notable for the following components:      Result Value   Sodium 134 (*)    Glucose, Bld  129 (*)    Calcium 8.8 (*)    All other components within normal limits  CBC  TROPONIN I (HIGH SENSITIVITY)     EKG  ED ECG REPORT I, 09/19/22, the attending physician, personally viewed and interpreted this ECG.  Date: 09/17/2022 EKG Time: 1354 Rate: 112 Rhythm: Sinus tachycardia QRS Axis: normal Intervals: normal ST/T Wave abnormalities: Diffuse T wave abnormalities Narrative Interpretation: no evidence of acute ischemia; no prior EKG available for comparison    RADIOLOGY  Chest x-ray: I independently viewed and interpreted the images; there are bilateral mild interstitial opacities with no focal consolidation or edema  PROCEDURES:  Critical Care performed: No  Procedures   MEDICATIONS ORDERED IN ED: Medications  acetaminophen (TYLENOL) tablet 1,000 mg (has no administration in time range)  acetaminophen (TYLENOL) tablet 1,000 mg (1,000 mg Oral Given 09/17/22 1349)  ipratropium-albuterol (DUONEB) 0.5-2.5 (3) MG/3ML nebulizer solution 3 mL (3 mLs Nebulization Given 09/17/22 1816)  ipratropium-albuterol (DUONEB) 0.5-2.5 (3) MG/3ML nebulizer solution 3 mL (3 mLs Nebulization Given 09/17/22 1816)  predniSONE (DELTASONE) tablet 60 mg (60 mg Oral Given 09/17/22 1816)     IMPRESSION / MDM / ASSESSMENT AND PLAN / ED COURSE  I reviewed the triage vital signs and the nursing notes.  46 year old female with PMH as noted above presents with cough, shortness of breath, fatigue, and malaise for the last few days and is positive for both influenza and COVID.  On exam she was mildly tachycardic at triage but this has resolved.  Her other vital signs are normal.  She has not demonstrated any acute respiratory distress and her O2 saturation is in the mid 90s on room air.  Chest x-ray shows bilateral mild interstitial opacities consistent with viral infection.  Lab workup shows no leukocytosis or anemia and her electrolytes are normal.  Differential diagnosis includes, but  is not limited to, COVID-19, influenza.  Patient's presentation is most consistent with acute presentation with potential threat to life or bodily function.  EKG shows some nonspecific T wave abnormalities with no prior available for comparison so I will obtain a troponin.  We will treat with bronchodilators and steroid and reassess.  At this time there is no specific indication for admission given that the patient does not require oxygen and has been tolerating p.o.  ----------------------------------------- 7:47 PM on 09/17/2022 -----------------------------------------  The patient's vital signs remain stable and she states she is feeling somewhat better.  I did consider whether the patient may benefit from admission given that she is at increased risk of adverse outcomes given her multiple medical comorbidities.  However at this time the patient has no respiratory distress or increased work of breathing, has no oxygen requirement, has been tolerating p.o., and states she feels well enough to go home.  I counseled her on the results of workup and the plan of care.  I will prescribe Paxlovid as well as albuterol and prednisone; I feel that the patient will benefit more from Paxlovid than Tamiflu and and am concerned for increased side effects if I prescribe both.  I give the patient strict return precautions and she expressed understanding.  She agrees to follow-up with her PMD.  FINAL CLINICAL IMPRESSION(S) / ED DIAGNOSES   Final diagnoses:  COVID-19  Influenza     Rx / DC Orders   ED Discharge Orders          Ordered    predniSONE (DELTASONE) 20 MG tablet  Daily        09/17/22 1946    nirmatrelvir/ritonavir (PAXLOVID) 20 x 150 MG & 10 x 100MG  TABS  2 times daily        09/17/22 1946    albuterol (VENTOLIN HFA) 108 (90 Base) MCG/ACT inhaler  Every 4 hours PRN        09/17/22 1946    Spacer/Aero-Hold Chamber Bags MISC        09/17/22 1946    ibuprofen (ADVIL) 600 MG tablet   Every 6 hours PRN        09/17/22 1946             Note:  This document was prepared using Dragon voice recognition software and may include unintentional dictation errors.    09/19/22, MD 09/17/22 1950

## 2022-09-17 NOTE — ED Triage Notes (Signed)
Patient to ED sent by [facility] for SOB- hx of asthma. Test positive for flu and COVID today. Symptoms started 2 nights ago.

## 2022-09-17 NOTE — ED Notes (Signed)
E-signature pad unavailable - Pt verbalized understanding of D/C information - no additional concerns at this time.  

## 2022-09-17 NOTE — Discharge Instructions (Signed)
Use the albuterol inhaler every 4 hours as needed for shortness of breath or chest tightness.  Take the prednisone daily as prescribed starting tomorrow.  Take the Paxlovid as prescribed and finish the full course.  You may take ibuprofen or Tylenol as needed for fever and/or bodyaches.  Return to the ER immediately for new, worsening, or persistent severe symptoms including increased shortness of breath, weakness or lightheadedness, vomiting, inability hold anything down or take your medicines, continued high fevers, severe headache, chest pain, or any other new or worsening symptoms that concern you.

## 2022-10-09 ENCOUNTER — Ambulatory Visit (INDEPENDENT_AMBULATORY_CARE_PROVIDER_SITE_OTHER): Payer: Self-pay

## 2022-10-09 ENCOUNTER — Ambulatory Visit
Admission: EM | Admit: 2022-10-09 | Discharge: 2022-10-09 | Disposition: A | Discharge status: Home / Self Care | Payer: Self-pay | Attending: Family Medicine | Admitting: Family Medicine

## 2022-10-09 ENCOUNTER — Encounter: Payer: Self-pay | Admitting: Emergency Medicine

## 2022-10-09 DIAGNOSIS — J4531 Mild persistent asthma with (acute) exacerbation: Secondary | ICD-10-CM

## 2022-10-09 LAB — GLUCOSE, CAPILLARY: Glucose-Capillary: 174 mg/dL — ABNORMAL HIGH (ref 70–99)

## 2022-10-09 MED ORDER — DEXAMETHASONE SODIUM PHOSPHATE 10 MG/ML IJ SOLN
10.0000 mg | Freq: Once | INTRAMUSCULAR | Status: AC
  Administered 2022-10-09: 10 mg via INTRAMUSCULAR

## 2022-10-09 MED ORDER — AZITHROMYCIN 250 MG PO TABS: 250.0000 mg | ORAL_TABLET | Freq: Every day | ORAL | 0 refills | Status: DC

## 2022-10-09 MED ORDER — PREDNISONE 20 MG PO TABS: 60.0000 mg | ORAL_TABLET | Freq: Every day | ORAL | 0 refills | Status: AC

## 2022-10-09 MED ORDER — BUDESONIDE-FORMOTEROL FUMARATE 80-4.5 MCG/ACT IN AERO: 2.0000 | INHALATION_SPRAY | Freq: Two times a day (BID) | RESPIRATORY_TRACT | 12 refills | Status: DC

## 2022-10-09 MED ORDER — IPRATROPIUM-ALBUTEROL 0.5-2.5 (3) MG/3ML IN SOLN
3.0000 mL | Freq: Once | RESPIRATORY_TRACT | Status: AC
  Administered 2022-10-09: 3 mL via RESPIRATORY_TRACT

## 2022-10-09 MED ORDER — FLUCONAZOLE 150 MG PO TABS: 150.0000 mg | ORAL_TABLET | ORAL | 0 refills | Status: AC

## 2022-10-09 NOTE — ED Triage Notes (Signed)
Patient c/o cough and chest congestion for 3 weeks ago.  Patient was diagnosed with COVID and Flu last month.  Patient states her cough has not improved.

## 2022-10-09 NOTE — Discharge Instructions (Addendum)
Stop by the pharmacy to pick up your inhaler, steroids and antibiotics.  If your symptoms does not improve or shortness shortness of breath worsens,  go to the emergency department.

## 2022-10-09 NOTE — ED Provider Notes (Signed)
[facility]    CSN: [id number] Arrival date & time: 10/09/22  0905      History   Chief Complaint Chief Complaint  Patient presents with   Cough    HPI Robin Hammond is a 47 y.o. female.   HPI   Robin Hammond presents for cough for the past 3 weeks. She had COVID and influenza at the same time on 09/17/22. Her family members have gotten well but she is not. No recent body aches, chills, nausea, vomiting, diarrhea, fevers or headache.   Has persistent irritating productive cough. She has history of asthma. Has difficulty talking with her cough.  She had been using her asthma inhaler more frequently. The pharmacy gave her symptoms on Wednesday but on Thursday her cough started up again. She is unsure that the pharmacist but believes it had to do with her blood pressure.  They were small red pills.  States she quit smoking in 2019.       Past Medical History:  Diagnosis Date   Anxiety    Depression    Diabetes mellitus without complication (HCC)    GERD (gastroesophageal reflux disease)    Hemorrhoids    Low serum vitamin D    Obesity    Sleep apnea     Patient Active Problem List   Diagnosis Date Noted   Noninfectious diarrhea    Diabetes mellitus type 2, controlled, without complications (HCC) 12/03/2015   Vitamin D deficiency 12/03/2015   Vitamin B12 deficiency 12/03/2015   Obesity, morbid, BMI 50 or higher (HCC) 12/02/2015   Depression 12/02/2015   FH: diabetes mellitus 12/02/2015   Bilateral low back pain without sciatica 12/02/2015   Right knee pain 12/02/2015   Fecal incontinence 12/02/2015    Past Surgical History:  Procedure Laterality Date   BREAST REDUCTION SURGERY     COLONOSCOPY WITH PROPOFOL N/A 01/14/2016   Procedure: COLONOSCOPY WITH PROPOFOL;  Surgeon: Midge Minium, MD;  Location: [facility];  Service: Endoscopy;  Laterality: N/A;   TONSILLECTOMY      OB History   No obstetric history on file.      Home Medications    Prior  to Admission medications   Medication Sig Start Date End Date Taking? Authorizing Provider  albuterol (VENTOLIN HFA) 108 (90 Base) MCG/ACT inhaler Inhale 2 puffs into the lungs every 4 (four) hours as needed for wheezing or shortness of breath. 09/17/22  Yes Dionne Bucy, MD  ASPIRIN 81 PO Take by mouth.   Yes [provider]  azithromycin (ZITHROMAX Z-PAK) 250 MG tablet Take 1 tablet (250 mg total) by mouth daily. Take 2 tablets on day 1 10/09/22  Yes Zyshonne Malecha, DO  budesonide-formoterol (SYMBICORT) 80-4.5 MCG/ACT inhaler Inhale 2 puffs into the lungs in the morning and at bedtime. 10/09/22  Yes Tishie Altmann, DO  fluconazole (DIFLUCAN) 150 MG tablet Take 1 tablet (150 mg total) by mouth every 3 (three) days for 2 doses. 10/09/22 10/13/22 Yes Reid Nawrot, DO  glipiZIDE (GLUCOTROL XL) 10 MG 24 hr tablet Take 1 tablet by mouth daily. 12/30/21 12/30/22 Yes [provider]  metFORMIN (GLUCOPHAGE) 500 MG tablet metformin 500 mg tablet   Yes [provider]  pravastatin (PRAVACHOL) 10 MG tablet Take 1 tablet by mouth at bedtime. 06/08/22  Yes [provider]  predniSONE (DELTASONE) 20 MG tablet Take 3 tablets (60 mg total) by mouth daily for 5 days. 10/09/22 10/14/22 Yes Jiraiya Mcewan, DO  B Complex-Folic Acid (B COMPLEX-VITAMIN B12 PO)  B Complex-Vitamin B12    [provider]  citalopram (CELEXA) 20 MG tablet citalopram 20 mg tablet    [provider]  ibuprofen (ADVIL) 600 MG tablet Take 1 tablet (600 mg total) by mouth every 6 (six) hours as needed. 09/17/22   Arta Silence, MD  medroxyPROGESTERone (PROVERA) 10 MG tablet  10/28/18   [provider]  meloxicam (MOBIC) 15 MG tablet Take 1 tablet (15 mg total) by mouth daily as needed for pain. 10/29/18   Coral Spikes, DO  ondansetron (ZOFRAN) 4 MG tablet Take 1 tablet (4 mg total) by mouth every 8 (eight) hours as needed for nausea or vomiting. 10/29/18   Coral Spikes, DO   pantoprazole (PROTONIX) 40 MG tablet pantoprazole 40 mg tablet,delayed release    [provider]  Spacer/Aero-Hold Chamber Bags MISC Use as directed with albuterol inhaler 09/17/22   Arta Silence, MD    Family History Family History  Problem Relation Age of Onset   Diabetes Mother    Hypertension Mother    Cancer Maternal Grandmother    Cancer Paternal Grandmother    Cancer Paternal Grandfather     Social History Social History   Tobacco Use   Smoking status: Every Day    Packs/day: 0.30    Types: Cigarettes   Smokeless tobacco: Never  Vaping Use   Vaping Use: Former  Substance Use Topics   Alcohol use: Yes    Alcohol/week: 3.0 - 4.0 standard drinks of alcohol    Types: 3 - 4 Standard drinks or equivalent per week   Drug use: No     Allergies   Oxycodone and Hydrocodone   Review of Systems Review of Systems: negative unless otherwise stated in HPI.      Physical Exam Triage Vital Signs ED Triage Vitals  Enc Vitals Group     BP 10/09/22 0922 (!) 147/98     Pulse Rate 10/09/22 0922 100     Resp 10/09/22 0922 16     Temp 10/09/22 0922 97.9 F (36.6 C)     Temp Source 10/09/22 0922 Oral     SpO2 10/09/22 0922 98 %     Weight 10/09/22 0917 (!) 334 lb (151.5 kg)     Height 10/09/22 0917 5\' 4"  (1.626 m)     Head Circumference --      Peak Flow --      Pain Score 10/09/22 0917 0     Pain Loc --      Pain Edu? --      Excl. in [place]? --    No data found.  Updated Vital Signs BP (!) 147/98 (BP Location: Left Arm)   Pulse 100   Temp 97.9 F (36.6 C) (Oral)   Resp 16   Ht 5\' 4"  (1.626 m)   Wt (!) 151.5 kg   LMP 09/10/2022 (Approximate) Comment: pt states shes always had irregular periods  SpO2 98%   BMI 57.33 kg/m   Visual Acuity Right Eye Distance:   Left Eye Distance:   Bilateral Distance:    Right Eye Near:   Left Eye Near:    Bilateral Near:     Physical Exam GEN:     alert, non-toxic appearing obese female in no distress     HENT:  mucus membranes moist, no nasal discharge EYES:   pupils equal and reactive, no scleral injection or discharge NECK:  normal ROM RESP:  no increased work of breathing, diffuse rhonchi and expiratory  wheezing, poor air movement, no tachypnea or distress CVS:   regular rate and rhythm Skin:   warm and dry    UC Treatments / Results  Labs (all labs ordered are listed, but only abnormal results are displayed) Labs Reviewed  GLUCOSE, CAPILLARY - Abnormal; Notable for the following components:      Result Value   Glucose-Capillary 174 (*)    All other components within normal limits  CBG MONITORING, ED    EKG   Radiology DG Chest 2 View  Result Date: 10/09/2022 CLINICAL DATA:  Cough and chest congestion for 3 weeks. EXAM: CHEST - 2 VIEW COMPARISON:  Chest radiograph 09/17/2019 FINDINGS: The cardiomediastinal silhouette is normal. There is no focal consolidation or pulmonary edema. There is no pleural effusion or pneumothorax There is no acute osseous abnormality. IMPRESSION: No radiographic evidence of acute cardiopulmonary process. Electronically Signed   By: Valetta Mole M.D.   On: 10/09/2022 09:37    Procedures Procedures (including critical care time)  Medications Ordered in UC Medications  ipratropium-albuterol (DUONEB) 0.5-2.5 (3) MG/3ML nebulizer solution 3 mL (3 mLs Nebulization Given 10/09/22 1006)  dexamethasone (DECADRON) injection 10 mg (10 mg Intramuscular Given 10/09/22 1006)    Initial Impression / Assessment and Plan / UC Course  I have reviewed the triage vital signs and the nursing notes.  Pertinent labs & imaging results that were available during my care of the patient were reviewed by me and considered in my medical decision making (see chart for details).       Pt is a 47 y.o. female former smoker, OSA, T2DM who presents for 3 weeks of cough that is not improving.  Robin Hammond is  afebrile here without recent antipyretics. Satting well on room air.  Overall pt is  non-toxic appearing, well hydrated, without respiratory distress. Pulmonary exam is remarkable for rhonchi and expiratory wheezing.  After shared decision making, we will pursue chest x-ray.  Given Decadron IM 10 mg and DuoNeb.  COVID  and influenza testing deferred due to length of symptoms. She had both COVID and influenza on 09/17/22.  Chest xray personally reviewed by me without focal pneumonia, pleural effusion, cardiomegaly or pneumothorax.  She reports improvement after DuoNeb. Wheezing resolved but rhonchi persist though also improved. Treat acute bronchitis with steroids and antibiotics as below.  Has history of antibiotic associated yeast infections. Diflucan Rx as well.  Advised to monitor her blood sugars and blood pressure while taking steroids. Typical duration of symptoms discussed. Return and ED precautions given and patient voiced understanding.   Discussed MDM, treatment plan and plan for follow-up with patient who agrees with plan.      Final Clinical Impressions(s) / UC Diagnoses   Final diagnoses:  Mild persistent asthma with acute exacerbation     Discharge Instructions      Stop by the pharmacy to pick up your inhaler, steroids and antibiotics.  If your symptoms does not improve or shortness shortness of breath worsens,  go to the emergency department.     ED Prescriptions     Medication Sig Dispense Auth. Provider   budesonide-formoterol (SYMBICORT) 80-4.5 MCG/ACT inhaler Inhale 2 puffs into the lungs in the morning and at bedtime. 1 each Blossie Raffel, DO   predniSONE (DELTASONE) 20 MG tablet Take 3 tablets (60 mg total) by mouth daily for 5 days. 15 tablet Mareta Chesnut, DO   azithromycin (ZITHROMAX Z-PAK) 250 MG tablet Take 1 tablet (250 mg total) by mouth daily. Take 2 tablets on  day 1 6 tablet Shyne Resch, DO   fluconazole (DIFLUCAN) 150 MG tablet Take 1 tablet (150 mg total) by mouth every 3 (three) days for 2 doses. 2 tablet Lyndee Hensen, DO      PDMP not reviewed this encounter.   Lyndee Hensen, DO 10/09/22 1105

## 2023-03-09 ENCOUNTER — Ambulatory Visit
Admission: EM | Admit: 2023-03-09 | Discharge: 2023-03-09 | Disposition: A | Discharge status: Home / Self Care | Payer: BC Managed Care – PPO | Attending: Emergency Medicine | Admitting: Emergency Medicine

## 2023-03-09 ENCOUNTER — Ambulatory Visit (INDEPENDENT_AMBULATORY_CARE_PROVIDER_SITE_OTHER): Payer: BC Managed Care – PPO

## 2023-03-09 DIAGNOSIS — M25562 Pain in left knee: Secondary | ICD-10-CM

## 2023-03-09 DIAGNOSIS — M1712 Unilateral primary osteoarthritis, left knee: Secondary | ICD-10-CM

## 2023-03-09 NOTE — ED Provider Notes (Signed)
[facility]    CSN: [id number] Arrival date & time: 03/09/23  1506      History   Chief Complaint Chief Complaint  Patient presents with   Knee Pain    HPI Robin Hammond is a 47 y.o. female.   47 year old obese female, Robin Hammond, presents to urgent care for complaint of left knee pain for 2 weeks.  Patient states she was wearing heels and was dancing at church and afterwards noticed pain and swelling to her left knee.  Patient states she has not taken anything for her knee pain, normal gait in urgent care , patient denies any fall or trauma.  Last menstrual period was yesterday.  Patient has past medical history of diabetes and hypertension  The history is provided by the patient. No language interpreter was used.    Past Medical History:  Diagnosis Date   Anxiety    Depression    Diabetes mellitus without complication (HCC)    GERD (gastroesophageal reflux disease)    Hemorrhoids    Low serum vitamin D    Obesity    Sleep apnea     Patient Active Problem List   Diagnosis Date Noted   Osteoarthritis of left knee 03/09/2023   Noninfectious diarrhea    Diabetes mellitus type 2, controlled, without complications (HCC) 12/03/2015   Vitamin D deficiency 12/03/2015   Vitamin B12 deficiency 12/03/2015   Obesity, morbid, BMI 50 or higher (HCC) 12/02/2015   Depression 12/02/2015   FH: diabetes mellitus 12/02/2015   Bilateral low back pain without sciatica 12/02/2015   Acute pain of left knee 12/02/2015   Fecal incontinence 12/02/2015    Past Surgical History:  Procedure Laterality Date   BREAST REDUCTION SURGERY     COLONOSCOPY WITH PROPOFOL N/A 01/14/2016   Procedure: COLONOSCOPY WITH PROPOFOL;  Surgeon: Midge Minium, MD;  Location: [facility];  Service: Endoscopy;  Laterality: N/A;   TONSILLECTOMY      OB History   No obstetric history on file.      Home Medications    Prior to Admission medications   Medication Sig Start Date End Date  Taking? Authorizing Provider  albuterol (VENTOLIN HFA) 108 (90 Base) MCG/ACT inhaler Inhale 2 puffs into the lungs every 4 (four) hours as needed for wheezing or shortness of breath. 09/17/22   Dionne Bucy, MD  ASPIRIN 81 PO Take by mouth.    [provider]  azithromycin (ZITHROMAX Z-PAK) 250 MG tablet Take 1 tablet (250 mg total) by mouth daily. Take 2 tablets on day 1 10/09/22   Brimage, Seward Meth, DO  B Complex-Folic Acid (B COMPLEX-VITAMIN B12 PO) B Complex-Vitamin B12    [provider]  budesonide-formoterol (SYMBICORT) 80-4.5 MCG/ACT inhaler Inhale 2 puffs into the lungs in the morning and at bedtime. 10/09/22   Katha Cabal, DO  citalopram (CELEXA) 20 MG tablet citalopram 20 mg tablet    [provider]  glipiZIDE (GLUCOTROL XL) 10 MG 24 hr tablet Take 1 tablet by mouth daily. 12/30/21 12/30/22  [provider]  ibuprofen (ADVIL) 600 MG tablet Take 1 tablet (600 mg total) by mouth every 6 (six) hours as needed. 09/17/22   Dionne Bucy, MD  medroxyPROGESTERone (PROVERA) 10 MG tablet  10/28/18   [provider]  meloxicam (MOBIC) 15 MG tablet Take 1 tablet (15 mg total) by mouth daily as needed for pain. 10/29/18   Tommie Sams, DO  metFORMIN (GLUCOPHAGE) 500 MG tablet metformin 500 mg tablet  [provider]  ondansetron (ZOFRAN) 4 MG tablet Take 1 tablet (4 mg total) by mouth every 8 (eight) hours as needed for nausea or vomiting. 10/29/18   Tommie Sams, DO  pantoprazole (PROTONIX) 40 MG tablet pantoprazole 40 mg tablet,delayed release    [provider]  pravastatin (PRAVACHOL) 10 MG tablet Take 1 tablet by mouth at bedtime. 06/08/22   [provider]  Spacer/Aero-Hold Chamber Bags MISC Use as directed with albuterol inhaler 09/17/22   Dionne Bucy, MD    Family History Family History  Problem Relation Age of Onset   Diabetes Mother    Hypertension Mother    Cancer Maternal Grandmother    Cancer  Paternal Grandmother    Cancer Paternal Grandfather     Social History Social History   Tobacco Use   Smoking status: Every Day    Packs/day: .3    Types: Cigarettes   Smokeless tobacco: Never  Vaping Use   Vaping Use: Former  Substance Use Topics   Alcohol use: Yes    Alcohol/week: 3.0 - 4.0 standard drinks of alcohol    Types: 3 - 4 Standard drinks or equivalent per week   Drug use: No     Allergies   Oxycodone and Hydrocodone   Review of Systems Review of Systems  Musculoskeletal:  Positive for arthralgias. Negative for gait problem, joint swelling and myalgias.  All other systems reviewed and are negative.    Physical Exam Triage Vital Signs ED Triage Vitals  Enc Vitals Group     BP 03/09/23 1527 (!) 146/83     Pulse Rate 03/09/23 1527 76     Resp 03/09/23 1527 16     Temp 03/09/23 1527 98.5 F (36.9 C)     Temp Source 03/09/23 1527 Oral     SpO2 03/09/23 1527 95 %     Weight --      Height --      Head Circumference --      Peak Flow --      Pain Score 03/09/23 1525 8     Pain Loc --      Pain Edu? --      Excl. in [place]? --    No data found.  Updated Vital Signs BP (!) 146/83 (BP Location: Left Arm)   Pulse 76   Temp 98.5 F (36.9 C) (Oral)   Resp 16   LMP 03/04/2023   SpO2 95%   Visual Acuity Right Eye Distance:   Left Eye Distance:   Bilateral Distance:    Right Eye Near:   Left Eye Near:    Bilateral Near:     Physical Exam Vitals and nursing note reviewed.  Constitutional:      General: She is not in acute distress.    Appearance: She is well-developed. She is obese. She is not ill-appearing, toxic-appearing or diaphoretic.  HENT:     Head: Normocephalic and atraumatic.  Eyes:     Conjunctiva/sclera: Conjunctivae normal.  Cardiovascular:     Rate and Rhythm: Normal rate and regular rhythm.     Pulses:          Dorsalis pedis pulses are 2+ on the left side.     Heart sounds: No murmur heard. Pulmonary:     Effort: Pulmonary  effort is normal. No respiratory distress.     Breath sounds: Normal breath sounds.  Abdominal:     Palpations: Abdomen is soft.     Tenderness: There is  no abdominal tenderness.  Musculoskeletal:        General: No swelling.     Cervical back: Neck supple.     Left knee: Bony tenderness present. No crepitus. Normal pulse.       Legs:     Comments: Patient has full range of motion of the left knee, no crepitus  Skin:    General: Skin is warm and dry.     Capillary Refill: Capillary refill takes less than 2 seconds.  Neurological:     General: No focal deficit present.     Mental Status: She is alert and oriented to person, place, and time.     GCS: GCS eye subscore is 4. GCS verbal subscore is 5. GCS motor subscore is 6.  Psychiatric:        Attention and Perception: Attention normal.        Mood and Affect: Mood normal.        Speech: Speech normal.        Behavior: Behavior normal.      UC Treatments / Results  Labs (all labs ordered are listed, but only abnormal results are displayed) Labs Reviewed - No data to display  EKG   Radiology DG Knee 2 Views Left  Result Date: 03/09/2023 CLINICAL DATA:  Left knee pain for 2 weeks.  No known injury. EXAM: LEFT KNEE - 1-2 VIEW COMPARISON:  None Available. FINDINGS: The mineralization and alignment are normal. There is no evidence of acute fracture or dislocation. Mild medial compartment joint space narrowing with a small knee joint effusion. The soft tissues otherwise appear unremarkable. IMPRESSION: Mild medial compartment joint space narrowing with small knee joint effusion. No acute osseous findings. Electronically Signed   By: Carey Bullocks M.D.   On: 03/09/2023 17:05    Procedures Procedures (including critical care time)  Medications Ordered in UC Medications - No data to display  Initial Impression / Assessment and Plan / UC Course  I have reviewed the triage vital signs and the nursing notes.  Pertinent labs &  imaging results that were available during my care of the patient were reviewed by me and considered in my medical decision making (see chart for details).  Clinical Course as of 03/09/23 2008  Tue Mar 09, 2023  1553 Pt adamant that she needs xray. Pt aware it is radiation and will only show bone issue(fracture,arthritis), no tendon or ligament injury and that xray is not medically indicated without traumatic injury. Pt states "I called up here and they said I could get an xray or I wouldn't of come". Pt has not tried any treatments or medication for left knee pain [JD]    Clinical Course User Index [JD] Hiroshi Krummel, Para March, NP   Discussed results and plan of care with patient.  Patient verbalized understanding this provider.  Ddx: Left knee pain, arthritis, effusion Final Clinical Impressions(s) / UC Diagnoses   Final diagnoses:  Acute pain of left knee  Osteoarthritis of left knee, unspecified osteoarthritis type     Discharge Instructions      Your xray was negative for acute bone injury: no fracture or dislocation noted. Take regular home meds as prescribed. Please take tylenol for pain as label directed. May use over the counter lidocaine gel or biofreeze for knee pain as label directed. Follow up with [facility] or orthopedist of your choice. May rest,ice,elevate, ace wrap of left leg when able. Avoid NSAIDS (aleve,naproxen,ibuprofen) due to history of hypertension.  ED Prescriptions   None    PDMP not reviewed this encounter.   Clancy Gourd, NP 03/09/23 2008

## 2023-03-09 NOTE — Discharge Instructions (Addendum)
Your xray was negative for acute bone injury: no fracture or dislocation noted. Take regular home meds as prescribed. Please take tylenol for pain as label directed. May use over the counter lidocaine gel or biofreeze for knee pain as label directed. Follow up with [facility] or orthopedist of your choice. May rest,ice,elevate, ace wrap of left leg when able. Avoid NSAIDS (aleve,naproxen,ibuprofen) due to history of hypertension.

## 2023-03-09 NOTE — ED Triage Notes (Signed)
Patient presents to UC for left knee pain x 2 weeks. States she was dancing and had on heels, knee continues to have swelling and pain. Yesterday she turned in her bed and heard "cracking noise." Not treating with any OTC pain meds.

## 2023-03-16 ENCOUNTER — Ambulatory Visit
Admission: EM | Admit: 2023-03-16 | Discharge: 2023-03-16 | Disposition: A | Discharge status: Home / Self Care | Payer: BC Managed Care – PPO

## 2023-03-16 DIAGNOSIS — L509 Urticaria, unspecified: Secondary | ICD-10-CM

## 2023-03-16 DIAGNOSIS — L299 Pruritus, unspecified: Secondary | ICD-10-CM | POA: Diagnosis not present

## 2023-03-16 MED ORDER — PREDNISONE 10 MG PO TABS: ORAL_TABLET | ORAL | 0 refills | Status: DC

## 2023-03-16 MED ORDER — METHYLPREDNISOLONE ACETATE 40 MG/ML IJ SUSP: 60.0000 mg | Freq: Once | INTRAMUSCULAR | Status: DC

## 2023-03-16 MED ORDER — METHYLPREDNISOLONE ACETATE 80 MG/ML IJ SUSP
60.0000 mg | Freq: Once | INTRAMUSCULAR | Status: AC
  Administered 2023-03-16: 60 mg via INTRAMUSCULAR

## 2023-03-16 NOTE — ED Triage Notes (Signed)
Pt states she has been feeling itchy for over 1 wks and noticed bumps to arms legs and buttocks.

## 2023-03-16 NOTE — Discharge Instructions (Addendum)
-  I am unsure as to the cause of your hives.  Discussed that there are many different causes.  See the handout. - Take a couple Benadryl when you get home tonight to help you sleep and help with the itching. - We did give you a corticosteroid tonight which I explained can keep you up.  It can also increase your blood pressure and your blood sugar so sure you are avoiding sugar and to many carbohydrates. - Consider taking Xyzal or Zyrtec during the day and Benadryl at bedtime for the itching. - Start the prednisone taper tomorrow afternoon. - If your symptoms are not getting better over the next week or they recur please follow-up with your PCP as you may need allergy testing. - If at any point you feel facial swelling, throat swelling or tightness, difficulty swallowing or breathing please go to the ER.

## 2023-03-16 NOTE — ED Provider Notes (Signed)
[facility]    CSN: [id number] Arrival date & time: 03/16/23  1851      History   Chief Complaint Chief Complaint  Patient presents with   Pruritis   Rash    HPI Robin Hammond is a 47 y.o. female presenting for approximately 1 week history of intermittent hives throughout entire body.  Patient cannot identify anything that could have caused her symptoms.  She denies any changes to detergents, body washes, perfumes, lotions, foods.  No new medications.  States starting a PPI 4 weeks ago and has not had any issues with it.  Patient reports she has applied triamcinolone ointment to her entire body and that did help initially.  She has not taken any antihistamines.  She reports having hives before but cannot remember what was the cause.  She denies any associated facial swelling, throat swelling, difficulty breathing.  No other complaints.  HPI  Past Medical History:  Diagnosis Date   Anxiety    Depression    Diabetes mellitus without complication (HCC)    GERD (gastroesophageal reflux disease)    Hemorrhoids    Low serum vitamin D    Obesity    Sleep apnea     Patient Active Problem List   Diagnosis Date Noted   Osteoarthritis of left knee 03/09/2023   Noninfectious diarrhea    Diabetes mellitus type 2, controlled, without complications (HCC) 12/03/2015   Vitamin D deficiency 12/03/2015   Vitamin B12 deficiency 12/03/2015   Obesity, morbid, BMI 50 or higher (HCC) 12/02/2015   Depression 12/02/2015   FH: diabetes mellitus 12/02/2015   Bilateral low back pain without sciatica 12/02/2015   Acute pain of left knee 12/02/2015   Fecal incontinence 12/02/2015    Past Surgical History:  Procedure Laterality Date   BREAST REDUCTION SURGERY     COLONOSCOPY WITH PROPOFOL N/A 01/14/2016   Procedure: COLONOSCOPY WITH PROPOFOL;  Surgeon: Midge Minium, MD;  Location: [facility] ENDOSCOPY;  Service: Endoscopy;  Laterality: N/A;   TONSILLECTOMY      OB History   No  obstetric history on file.      Home Medications    Prior to Admission medications   Medication Sig Start Date End Date Taking? Authorizing Provider  glipiZIDE (GLUCOTROL XL) 10 MG 24 hr tablet Take 1 tablet by mouth daily. 12/30/21 03/16/23 Yes [provider]  ibuprofen (ADVIL) 600 MG tablet Take 1 tablet (600 mg total) by mouth every 6 (six) hours as needed. 09/17/22  Yes Dionne Bucy, MD  lisinopril (ZESTRIL) 2.5 MG tablet Take 10 mg by mouth daily.   Yes [provider]  medroxyPROGESTERone (PROVERA) 10 MG tablet  10/28/18  Yes [provider]  metFORMIN (GLUCOPHAGE) 500 MG tablet metformin 500 mg tablet   Yes [provider]  pantoprazole (PROTONIX) 40 MG tablet pantoprazole 40 mg tablet,delayed release   Yes [provider]  pravastatin (PRAVACHOL) 10 MG tablet Take 1 tablet by mouth at bedtime. 06/08/22  Yes [provider]  predniSONE (DELTASONE) 10 MG tablet Take 5 tabs p.o. on day 1 and decrease by 1 tablet daily until complete 03/16/23  Yes Shirlee Latch, PA-C  Semaglutide (OZEMPIC, 0.25 OR 0.5 MG/DOSE, [place]) Inject into the skin once a week.   Yes [provider]  Spacer/Aero-Hold Chamber Bags MISC Use as directed with albuterol inhaler 09/17/22  Yes Dionne Bucy, MD  albuterol (VENTOLIN HFA) 108 (90 Base) MCG/ACT inhaler Inhale 2 puffs into the lungs every 4 (four) hours  as needed for wheezing or shortness of breath. 09/17/22   Dionne Bucy, MD  ASPIRIN 81 PO Take by mouth.    [provider]  B Complex-Folic Acid (B COMPLEX-VITAMIN B12 PO) B Complex-Vitamin B12    [provider]  budesonide-formoterol (SYMBICORT) 80-4.5 MCG/ACT inhaler Inhale 2 puffs into the lungs in the morning and at bedtime. 10/09/22   Katha Cabal, DO  citalopram (CELEXA) 20 MG tablet citalopram 20 mg tablet    [provider]  meloxicam (MOBIC) 15 MG tablet Take 1 tablet (15 mg total) by mouth daily  as needed for pain. 10/29/18   Tommie Sams, DO  ondansetron (ZOFRAN) 4 MG tablet Take 1 tablet (4 mg total) by mouth every 8 (eight) hours as needed for nausea or vomiting. 10/29/18   Tommie Sams, DO    Family History Family History  Problem Relation Age of Onset   Diabetes Mother    Hypertension Mother    Cancer Maternal Grandmother    Cancer Paternal Grandmother    Cancer Paternal Grandfather     Social History Social History   Tobacco Use   Smoking status: Every Day    Packs/day: .3    Types: Cigarettes   Smokeless tobacco: Never  Vaping Use   Vaping Use: Former  Substance Use Topics   Alcohol use: Yes    Alcohol/week: 3.0 - 4.0 standard drinks of alcohol    Types: 3 - 4 Standard drinks or equivalent per week   Drug use: No     Allergies   Oxycodone and Hydrocodone   Review of Systems Review of Systems  Constitutional:  Negative for fatigue.  HENT:  Negative for congestion, facial swelling, trouble swallowing and voice change.   Respiratory:  Negative for cough, chest tightness and shortness of breath.   Cardiovascular:  Negative for chest pain.  Gastrointestinal:  Negative for abdominal pain.  Skin:  Positive for rash.  Neurological:  Negative for weakness and headaches.     Physical Exam Triage Vital Signs ED Triage Vitals  Enc Vitals Group     BP 03/16/23 1912 138/82     Pulse Rate 03/16/23 1912 76     Resp 03/16/23 1912 18     Temp 03/16/23 1912 98 F (36.7 C)     Temp Source 03/16/23 1912 Oral     SpO2 --      Weight --      Height --      Head Circumference --      Peak Flow --      Pain Score 03/16/23 1904 0     Pain Loc --      Pain Edu? --      Excl. in GC? --    No data found.  Updated Vital Signs BP 138/82   Pulse 76   Temp 98 F (36.7 C) (Oral)   Resp 18   LMP 03/04/2023    Physical Exam Vitals and nursing note reviewed.  Constitutional:      General: She is not in acute distress.    Appearance: Normal appearance. She  is not ill-appearing or toxic-appearing.  HENT:     Head: Normocephalic and atraumatic.     Nose: Nose normal.     Mouth/Throat:     Mouth: Mucous membranes are moist.     Pharynx: Oropharynx is clear.  Eyes:     General: No scleral icterus.       Right eye: No discharge.  Left eye: No discharge.     Conjunctiva/sclera: Conjunctivae normal.  Cardiovascular:     Rate and Rhythm: Normal rate and regular rhythm.     Heart sounds: Normal heart sounds.  Pulmonary:     Effort: Pulmonary effort is normal. No respiratory distress.     Breath sounds: Normal breath sounds.  Musculoskeletal:     Cervical back: Neck supple.  Skin:    General: Skin is dry.     Findings: Rash present.     Comments: Large scattered erythematous wheals on abdomen, legs, neck, torso.  Neurological:     General: No focal deficit present.     Mental Status: She is alert. Mental status is at baseline.     Motor: No weakness.     Gait: Gait normal.  Psychiatric:        Mood and Affect: Mood normal.        Behavior: Behavior normal.        Thought Content: Thought content normal.      UC Treatments / Results  Labs (all labs ordered are listed, but only abnormal results are displayed) Labs Reviewed - No data to display  EKG   Radiology No results found.  Procedures Procedures (including critical care time)  Medications Ordered in UC Medications  methylPREDNISolone acetate (DEPO-MEDROL) injection 60 mg (has no administration in time range)    Initial Impression / Assessment and Plan / UC Course  I have reviewed the triage vital signs and the nursing notes.  Pertinent labs & imaging results that were available during my care of the patient were reviewed by me and considered in my medical decision making (see chart for details).   47 year old female presents for hives which have been intermittent over the past week.  Cannot identify a potential trigger for the hives.  Has used triamcinolone  ointment but has not tried any antihistamines.  Denies any associated swelling or difficulty breathing.  On exam she does have multiple scattered large erythematous wheals throughout her body.  No intraoral or facial swelling.  Chest clear.  Discussed that she may benefit from corticosteroids at this time.  She would like an injection.  I did advise her that this could keep her awake if given this late.  She says she would like to take it anyway.  Advised it can increase blood pressure and blood sugar so she should monitor that.  Advised taking Benadryl before bed.  May prefer Zyrtec or Xyzal during the day and Benadryl at night.  Start prednisone taper tomorrow afternoon.  Cool compresses.  Reviewed return and ER precautions.   Final Clinical Impressions(s) / UC Diagnoses   Final diagnoses:  Hives  Pruritus     Discharge Instructions      -I am unsure as to the cause of your hives.  Discussed that there are many different causes.  See the handout. - Take a couple Benadryl when you get home tonight to help you sleep and help with the itching. - We did give you a corticosteroid tonight which I explained can keep you up.  It can also increase your blood pressure and your blood sugar so sure you are avoiding sugar and to many carbohydrates. - Consider taking Xyzal or Zyrtec during the day and Benadryl at bedtime for the itching. - Start the prednisone taper tomorrow afternoon. - If your symptoms are not getting better over the next week or they recur please follow-up with your PCP as you may need allergy  testing. - If at any point you feel facial swelling, throat swelling or tightness, difficulty swallowing or breathing please go to the ER.     ED Prescriptions     Medication Sig Dispense Auth. Provider   predniSONE (DELTASONE) 10 MG tablet Take 5 tabs p.o. on day 1 and decrease by 1 tablet daily until complete 15 tablet Shirlee Latch, PA-C      PDMP not reviewed this encounter.    Shirlee Latch, PA-C 03/16/23 1942

## 2023-06-09 ENCOUNTER — Ambulatory Visit
Admission: EM | Admit: 2023-06-09 | Discharge: 2023-06-09 | Disposition: A | Discharge status: Home / Self Care | Payer: BC Managed Care – PPO | Attending: Family Medicine | Admitting: Family Medicine

## 2023-06-09 DIAGNOSIS — H6993 Unspecified Eustachian tube disorder, bilateral: Secondary | ICD-10-CM

## 2023-06-09 DIAGNOSIS — R42 Dizziness and giddiness: Secondary | ICD-10-CM

## 2023-06-09 DIAGNOSIS — J029 Acute pharyngitis, unspecified: Secondary | ICD-10-CM | POA: Diagnosis not present

## 2023-06-09 LAB — GLUCOSE, CAPILLARY: Glucose-Capillary: 91 mg/dL (ref 70–99)

## 2023-06-09 MED ORDER — DEXAMETHASONE SODIUM PHOSPHATE 10 MG/ML IJ SOLN
10.0000 mg | Freq: Once | INTRAMUSCULAR | Status: AC
  Administered 2023-06-09: 10 mg via INTRAMUSCULAR

## 2023-06-09 NOTE — ED Triage Notes (Signed)
Sx started last Thursday  Sore throat, bilateral ear pain, post nasal drip.

## 2023-06-09 NOTE — ED Provider Notes (Signed)
[facility]    CSN: [id number] Arrival date & time: 06/09/23  1619      History   Chief Complaint Chief Complaint  Patient presents with   Otalgia   Sore Throat    HPI Robin Hammond is a 47 y.o. female.   HPI  History obtained from the patient. Robin Hammond presents for ear fullness and drainage that started on Thursday.  Has a mild scratchy sore throat that started on Monday.  Denies any sick contacts.  Does not want a COVID test.  Get a dizzy spell today walking back to her car like she almost lost her balance.  Denies chest pain, shortness of breath, fever, nausea, vomiting, diarrhea, pelvic pain, urinary symptoms.  She is diabetic and recently went up on her Ozempic.      Past Medical History:  Diagnosis Date   Anxiety    Depression    Diabetes mellitus without complication (HCC)    GERD (gastroesophageal reflux disease)    Hemorrhoids    Low serum vitamin D    Obesity    Sleep apnea     Patient Active Problem List   Diagnosis Date Noted   Osteoarthritis of left knee 03/09/2023   Noninfectious diarrhea    Diabetes mellitus type 2, controlled, without complications (HCC) 12/03/2015   Vitamin D deficiency 12/03/2015   Vitamin B12 deficiency 12/03/2015   Obesity, morbid, BMI 50 or higher (HCC) 12/02/2015   Depression 12/02/2015   FH: diabetes mellitus 12/02/2015   Bilateral low back pain without sciatica 12/02/2015   Acute pain of left knee 12/02/2015   Fecal incontinence 12/02/2015    Past Surgical History:  Procedure Laterality Date   BREAST REDUCTION SURGERY     COLONOSCOPY WITH PROPOFOL N/A 01/14/2016   Procedure: COLONOSCOPY WITH PROPOFOL;  Surgeon: Midge Minium, MD;  Location: [facility];  Service: Endoscopy;  Laterality: N/A;   TONSILLECTOMY      OB History   No obstetric history on file.      Home Medications    Prior to Admission medications   Medication Sig Start Date End Date Taking? Authorizing Provider  lisinopril  (ZESTRIL) 2.5 MG tablet Take 10 mg by mouth daily.   Yes [provider]  metFORMIN (GLUCOPHAGE) 500 MG tablet metformin 500 mg tablet   Yes [provider]  pravastatin (PRAVACHOL) 10 MG tablet Take 1 tablet by mouth at bedtime. 06/08/22  Yes [provider]  Semaglutide (OZEMPIC, 0.25 OR 0.5 MG/DOSE, [place]) Inject into the skin once a week.   Yes [provider]  albuterol (VENTOLIN HFA) 108 (90 Base) MCG/ACT inhaler Inhale 2 puffs into the lungs every 4 (four) hours as needed for wheezing or shortness of breath. 09/17/22   Dionne Bucy, MD  ASPIRIN 81 PO Take by mouth.    [provider]  B Complex-Folic Acid (B COMPLEX-VITAMIN B12 PO) B Complex-Vitamin B12    [provider]  budesonide-formoterol (SYMBICORT) 80-4.5 MCG/ACT inhaler Inhale 2 puffs into the lungs in the morning and at bedtime. 10/09/22   Katha Cabal, DO  citalopram (CELEXA) 20 MG tablet citalopram 20 mg tablet    [provider]  glipiZIDE (GLUCOTROL XL) 10 MG 24 hr tablet Take 1 tablet by mouth daily. 12/30/21 03/16/23  [provider]  ibuprofen (ADVIL) 600 MG tablet Take 1 tablet (600 mg total) by mouth every 6 (six) hours as needed. 09/17/22   Dionne Bucy, MD  medroxyPROGESTERone (PROVERA) 10 MG tablet  10/28/18  [provider]  meloxicam (MOBIC) 15 MG tablet Take 1 tablet (15 mg total) by mouth daily as needed for pain. 10/29/18   Tommie Sams, DO  ondansetron (ZOFRAN) 4 MG tablet Take 1 tablet (4 mg total) by mouth every 8 (eight) hours as needed for nausea or vomiting. 10/29/18   Tommie Sams, DO  pantoprazole (PROTONIX) 40 MG tablet pantoprazole 40 mg tablet,delayed release    [provider]  predniSONE (DELTASONE) 10 MG tablet Take 5 tabs p.o. on day 1 and decrease by 1 tablet daily until complete 03/16/23   Shirlee Latch, PA-C  Spacer/Aero-Hold Chamber Bags MISC Use as directed with albuterol inhaler 09/17/22   Dionne Bucy, MD    Family History Family History  Problem Relation Age of Onset   Diabetes Mother    Hypertension Mother    Cancer Maternal Grandmother    Cancer Paternal Grandmother    Cancer Paternal Grandfather     Social History Social History   Tobacco Use   Smoking status: Every Day    Current packs/day: 0.30    Types: Cigarettes   Smokeless tobacco: Never  Vaping Use   Vaping status: Former  Substance Use Topics   Alcohol use: Yes    Alcohol/week: 3.0 - 4.0 standard drinks of alcohol    Types: 3 - 4 Standard drinks or equivalent per week   Drug use: No     Allergies   Oxycodone and Hydrocodone   Review of Systems Review of Systems: negative unless otherwise stated in HPI.      Physical Exam Triage Vital Signs ED Triage Vitals  Encounter Vitals Group     BP --      Systolic BP Percentile --      Diastolic BP Percentile --      Pulse --      Resp --      Temp --      Temp src --      SpO2 --      Weight 06/09/23 1635 (!) 304 lb (137.9 kg)     Height --      Head Circumference --      Peak Flow --      Pain Score 06/09/23 1636 5     Pain Loc --      Pain Education --      Exclude from Growth Chart --    No data found.  Updated Vital Signs BP 132/84 (BP Location: Right Arm)   Pulse 74   Temp 98.5 F (36.9 C) (Oral)   Resp 17   Wt (!) 137.9 kg   SpO2 96%   BMI 52.18 kg/m   Visual Acuity Right Eye Distance:   Left Eye Distance:   Bilateral Distance:    Right Eye Near:   Left Eye Near:    Bilateral Near:     Physical Exam GEN:     alert, non-ill appearing female in no distress    HENT:  mucus membranes moist, no nasal discharge, bilateral TM effusions without erythema or bulging EYES:   pupils equal and reactive, no scleral injection or discharge NECK:  normal ROM, submandibular lymphadenopathy, no meningismus   RESP:  no increased work of breathing, clear to auscultation bilaterally CVS:   regular rate and rhythm NEURO:  alert,  oriented, speech normal, CN 2-12 grossly intact, no facial droop,  sensation grossly intact, strength 5/5 bilateral UE and LE, normal coordination, Negative Romberg , normal gait  Skin:   warm and dry, no rash on visible skin    UC Treatments / Results  Labs (all labs ordered are listed, but only abnormal results are displayed) Labs Reviewed  GLUCOSE, CAPILLARY  CBG MONITORING, ED    EKG   Radiology No results found.  Procedures Procedures (including critical care time)  Medications Ordered in UC Medications  dexamethasone (DECADRON) injection 10 mg (10 mg Intramuscular Given 06/09/23 1800)    Initial Impression / Assessment and Plan / UC Course  I have reviewed the triage vital signs and the nursing notes.  Pertinent labs & imaging results that were available during my care of the patient were reviewed by me and considered in my medical decision making (see chart for details).       Pt is a 47 y.o. female with T2DM, morbid obesity who presents for acute onset lightheadedness today and ear fullness with respiratory symptoms for the past week.  Robin Hammond is afebrile here without recent antipyretics. Satting well on room air.  Vital signs stable.  Overall pt is non-ill appearing, well hydrated, without respiratory distress.  Cardiopulmonary exam are unremarkable.  COVID testing declined.  On exam, she has bilateral TM effusions and I suspect she has some eustachian tube dysfunction as well.  Discussed IM versus oral antibiotics and she wanted to proceed with IM antibiotics.  Given Decadron 10 mg.  Advised patient to watch her blood sugars and blood pressure while taking this medication.  CBG here 91.  Patient states her blood sugar has never been this low.  Patient just ate a bag of potato chips while waiting in the waiting area.  She notes that she recently went up on her Ozempic from 0.5 to 1 mg/week.  She has not eaten anything.  On chart review, she is prescribed glipizide 5 mg  daily, Ozempic 1 mg weekly, metformin 500 mg twice a day for her diabetes.  I suspect that she had an acute hypoglycemic episode that is resolving after eating the potato chips.  Neurological exam is unremarkable.   Return and ED precautions given and voiced understanding. Discussed MDM, treatment plan and plan for follow-up with patient who agrees with plan.     Final Clinical Impressions(s) / UC Diagnoses   Final diagnoses:  Dizziness  Eustachian tube dysfunction, bilateral  Sore throat     Discharge Instructions      You can take Tylenol and/or Ibuprofen as needed for fever reduction and pain relief.    For sore throat: try warm salt water gargles, Mucinex sore throat cough drops or cepacol lozenges, throat spray, warm tea or water with lemon/honey, popsicles or ice, or OTC cold relief medicine for throat discomfort. You can also purchase chloraseptic spray at the pharmacy or dollar store.   For congestion: take a daily anti-histamine like Zyrtec, Claritin, and a oral decongestant, such as pseudoephedrine.  You can also use Flonase 1-2 sprays in each nostril daily. Afrin is also a good option, if you do not have high blood pressure.    It is important to stay hydrated: drink plenty of fluids (water, gatorade/powerade/pedialyte, juices, or teas) to keep your throat moisturized and help further relieve irritation/discomfort.    Return or go to the Emergency Department if symptoms worsen or do not improve in the next few days      ED Prescriptions   None    PDMP not reviewed this encounter.   Katha Cabal, DO 06/12/23 1254

## 2023-06-09 NOTE — Discharge Instructions (Addendum)
You can take Tylenol and/or Ibuprofen as needed for fever reduction and pain relief.    For sore throat: try warm salt water gargles, Mucinex sore throat cough drops or cepacol lozenges, throat spray, warm tea or water with lemon/honey, popsicles or ice, or OTC cold relief medicine for throat discomfort. You can also purchase chloraseptic spray at the pharmacy or dollar store.   For congestion: take a daily anti-histamine like Zyrtec, Claritin, and a oral decongestant, such as pseudoephedrine.  You can also use Flonase 1-2 sprays in each nostril daily. Afrin is also a good option, if you do not have high blood pressure.    It is important to stay hydrated: drink plenty of fluids (water, gatorade/powerade/pedialyte, juices, or teas) to keep your throat moisturized and help further relieve irritation/discomfort.    Return or go to the Emergency Department if symptoms worsen or do not improve in the next few days

## 2023-08-09 ENCOUNTER — Ambulatory Visit (INDEPENDENT_AMBULATORY_CARE_PROVIDER_SITE_OTHER): Payer: BC Managed Care – PPO | Admitting: Obstetrics & Gynecology

## 2023-08-09 ENCOUNTER — Other Ambulatory Visit (HOSPITAL_COMMUNITY)
Admission: RE | Admit: 2023-08-09 | Discharge: 2023-08-09 | Disposition: A | Discharge status: Home / Self Care | Payer: BC Managed Care – PPO | Source: Ambulatory Visit | Attending: Obstetrics & Gynecology | Admitting: Obstetrics & Gynecology

## 2023-08-09 VITALS — BP 121/78 | HR 75 | Resp 16 | Ht 64.0 in | Wt 304.1 lb

## 2023-08-09 DIAGNOSIS — T8332XA Displacement of intrauterine contraceptive device, initial encounter: Secondary | ICD-10-CM

## 2023-08-09 DIAGNOSIS — N921 Excessive and frequent menstruation with irregular cycle: Secondary | ICD-10-CM

## 2023-08-09 DIAGNOSIS — Z13220 Encounter for screening for lipoid disorders: Secondary | ICD-10-CM

## 2023-08-09 DIAGNOSIS — Z124 Encounter for screening for malignant neoplasm of cervix: Secondary | ICD-10-CM | POA: Diagnosis present

## 2023-08-09 DIAGNOSIS — M545 Low back pain, unspecified: Secondary | ICD-10-CM | POA: Diagnosis not present

## 2023-08-09 DIAGNOSIS — Z1231 Encounter for screening mammogram for malignant neoplasm of breast: Secondary | ICD-10-CM

## 2023-08-09 DIAGNOSIS — Z3202 Encounter for pregnancy test, result negative: Secondary | ICD-10-CM | POA: Diagnosis not present

## 2023-08-09 DIAGNOSIS — Z1159 Encounter for screening for other viral diseases: Secondary | ICD-10-CM | POA: Insufficient documentation

## 2023-08-09 DIAGNOSIS — N898 Other specified noninflammatory disorders of vagina: Secondary | ICD-10-CM

## 2023-08-09 LAB — POCT URINALYSIS DIPSTICK OB
Bilirubin, UA: NEGATIVE
Blood, UA: NEGATIVE
Glucose, UA: NEGATIVE
Ketones, UA: NEGATIVE
Leukocytes, UA: NEGATIVE
Nitrite, UA: NEGATIVE
Spec Grav, UA: 1.025 (ref 1.010–1.025)
Urobilinogen, UA: 0.2 U/dL
pH, UA: 5 (ref 5.0–8.0)

## 2023-08-09 LAB — POCT URINE PREGNANCY: Preg Test, Ur: NEGATIVE

## 2023-08-09 NOTE — Progress Notes (Signed)
    GYNECOLOGY PROGRESS NOTE  Subjective:    Patient ID: Robin Hammond, female    DOB: 04-18-1976, 47 y.o.   MRN: [id number]  HPI  Patient is a 47 y.o. single G0 here as a new patient with a lifetime of irregular periods. She had menarche around 47 yo and would only have a few periods per year. She has been treated with OCPs, depo provera throughout her lifetime. She had an EMBX at [facility] around 2020 that showed simple hyperplasia. She was treated with a Mirena IUD. However she tells me that she had a follow up ultrasound and the IUD was not seen. I have scoured the notes and could not find this result or any note mentioning this. She was abstinent for about 20 years but started a sexual relationship about 9 months ago.  She report heavy long bleeding occasions, most recently for 2 weeks, very heavy.  The following portions of the patient's history were reviewed and updated as appropriate: allergies, current medications, past family history, past medical history, past social history, past surgical history, and problem list.  Review of Systems Pertinent items are noted in HPI.  She started ozembic about 2 months ago.  Objective:   Blood pressure 121/78, pulse 75, resp. rate 16, height 5\' 4"  (1.626 m), weight (!) 304 lb 1.6 oz (137.9 kg), last menstrual period 06/09/2023. Body mass index is 52.2 kg/m. Well nourished, well hydrated Black female, no apparent distress She is ambulating and conversing normally. EG- normal Speculum exam reveals a discharge No IUD strings seen UPT negative today   Assessment:   1. Encounter for screening mammogram for malignant neoplasm of breast   2. Screening cholesterol level   3. Acute left-sided low back pain without sciatica   4. Intrauterine contraceptive device threads lost, initial encounter   5. Menorrhagia with irregular cycle      Plan:   1. Encounter for screening mammogram for malignant neoplasm of breast  - MM 3D SCREENING MAMMOGRAM  BILATERAL BREAST  2. Screening cholesterol level  - Lipid panel  3. Acute left-sided low back pain without sciatica  - POC Urinalysis Dipstick OB  4. Intrauterine contraceptive device threads lost, initial encounter  - POCT urine pregnancy - US PELVIC COMPLETE WITH TRANSVAGINAL  5. Menorrhagia with irregular cycle  - CBC - TSH - US PELVIC COMPLETE WITH TRANSVAGINAL  Her ultrasound will be tomorrow and I will see her then.

## 2023-08-09 NOTE — Addendum Note (Signed)
Addended by: Tommie Raymond on: 08/09/2023 04:59 PM   Modules accepted: Orders

## 2023-08-10 ENCOUNTER — Ambulatory Visit (INDEPENDENT_AMBULATORY_CARE_PROVIDER_SITE_OTHER): Payer: BC Managed Care – PPO

## 2023-08-10 ENCOUNTER — Other Ambulatory Visit: Payer: BC Managed Care – PPO

## 2023-08-10 DIAGNOSIS — T8332XD Displacement of intrauterine contraceptive device, subsequent encounter: Secondary | ICD-10-CM | POA: Diagnosis not present

## 2023-08-10 DIAGNOSIS — N921 Excessive and frequent menstruation with irregular cycle: Secondary | ICD-10-CM | POA: Diagnosis not present

## 2023-08-11 LAB — CERVICOVAGINAL ANCILLARY ONLY
Bacterial Vaginitis (gardnerella): NEGATIVE
Candida Glabrata: POSITIVE — AB
Candida Vaginitis: NEGATIVE
Comment: NEGATIVE
Comment: NEGATIVE
Comment: NEGATIVE
Comment: NEGATIVE
Trichomonas: NEGATIVE

## 2023-08-11 LAB — CBC
Hematocrit: 37.8 % (ref 34.0–46.6)
Hemoglobin: 12.8 g/dL (ref 11.1–15.9)
MCH: 31.5 pg (ref 26.6–33.0)
MCHC: 33.9 g/dL (ref 31.5–35.7)
MCV: 93 fL (ref 79–97)
Platelets: 299 10*3/uL (ref 150–450)
RBC: 4.06 x10E6/uL (ref 3.77–5.28)
RDW: 13.2 % (ref 11.7–15.4)
WBC: 10 10*3/uL (ref 3.4–10.8)

## 2023-08-11 LAB — LIPID PANEL
Chol/HDL Ratio: 3.8 ratio (ref 0.0–4.4)
Cholesterol, Total: 170 mg/dL (ref 100–199)
HDL: 45 mg/dL (ref >39)
LDL Chol Calc (NIH): 97 mg/dL (ref 0–99)
Triglycerides: 161 mg/dL — ABNORMAL HIGH (ref 0–149)
VLDL Cholesterol Cal: 28 mg/dL (ref 5–40)

## 2023-08-11 LAB — TSH: TSH: 1.39 u[IU]/mL (ref 0.450–4.500)

## 2023-08-13 LAB — CYTOLOGY - PAP
Adequacy: ABNORMAL
Chlamydia: NEGATIVE
Comment: NEGATIVE
Comment: NEGATIVE
Comment: NORMAL
Neisseria Gonorrhea: NEGATIVE

## 2023-08-16 ENCOUNTER — Telehealth: Payer: Self-pay | Admitting: Obstetrics & Gynecology

## 2023-08-16 NOTE — Telephone Encounter (Signed)
I called to tell her that the IUD is not in her uterus. She is aware that she does not have contraception at this time. She is scheduled to have another IUD inserted Monday. I told her not to have unprotected IC prior to insertion.

## 2023-08-23 ENCOUNTER — Other Ambulatory Visit (HOSPITAL_COMMUNITY)
Admission: RE | Admit: 2023-08-23 | Discharge: 2023-08-23 | Disposition: A | Discharge status: Home / Self Care | Payer: BC Managed Care – PPO | Source: Ambulatory Visit | Attending: Obstetrics & Gynecology | Admitting: Obstetrics & Gynecology

## 2023-08-23 ENCOUNTER — Ambulatory Visit: Payer: BC Managed Care – PPO | Admitting: Obstetrics & Gynecology

## 2023-08-23 ENCOUNTER — Other Ambulatory Visit (HOSPITAL_COMMUNITY)
Admission: RE | Admit: 2023-08-23 | Discharge: 2023-08-23 | Disposition: A | Discharge status: Home / Self Care | Payer: BC Managed Care – PPO | Source: Ambulatory Visit

## 2023-08-23 VITALS — BP 137/92 | HR 85 | Wt 298.3 lb

## 2023-08-23 DIAGNOSIS — Z30430 Encounter for insertion of intrauterine contraceptive device: Secondary | ICD-10-CM

## 2023-08-23 DIAGNOSIS — L292 Pruritus vulvae: Secondary | ICD-10-CM | POA: Diagnosis present

## 2023-08-23 DIAGNOSIS — N852 Hypertrophy of uterus: Secondary | ICD-10-CM | POA: Insufficient documentation

## 2023-08-23 DIAGNOSIS — N898 Other specified noninflammatory disorders of vagina: Secondary | ICD-10-CM | POA: Insufficient documentation

## 2023-08-23 DIAGNOSIS — Z0000 Encounter for general adult medical examination without abnormal findings: Secondary | ICD-10-CM | POA: Insufficient documentation

## 2023-08-23 DIAGNOSIS — N921 Excessive and frequent menstruation with irregular cycle: Secondary | ICD-10-CM | POA: Insufficient documentation

## 2023-08-23 DIAGNOSIS — Z3202 Encounter for pregnancy test, result negative: Secondary | ICD-10-CM

## 2023-08-23 DIAGNOSIS — Z124 Encounter for screening for malignant neoplasm of cervix: Secondary | ICD-10-CM | POA: Insufficient documentation

## 2023-08-23 DIAGNOSIS — B3731 Acute candidiasis of vulva and vagina: Secondary | ICD-10-CM | POA: Insufficient documentation

## 2023-08-23 LAB — POCT URINE PREGNANCY: Preg Test, Ur: NEGATIVE

## 2023-08-23 MED ORDER — LEVONORGESTREL 20 MCG/DAY IU IUD
1.0000 | INTRAUTERINE_SYSTEM | Freq: Once | INTRAUTERINE | Status: AC
Start: 2023-08-23 — End: 2023-08-23
  Administered 2023-08-23: 1 via INTRAUTERINE

## 2023-08-23 NOTE — Progress Notes (Addendum)
    GYNECOLOGY PROGRESS NOTE  Subjective:    Patient ID: Robin Hammond, female    DOB: January 26, 1976, 47 y.o.   MRN: [id number]  HPI  Patient is a single 47 yo G0 who is here for several reasons. 1) her pap last week was "inadequate" and needs to be repeated  2). She has a long h/o irregular periods. She had an EMBX in 2020 at [place] around 2020 that showed simple hyperplasia. She was treated with a Mirena IUD. However, she did not get any followup. When I met her last week, I did not see IUD strings and ordered a ultrasound. No IUD was seen.   3) She has some vulvar itching and would like to be checked for yeast  The following portions of the patient's history were reviewed and updated as appropriate: allergies, current medications, past family history, past medical history, past social history, past surgical history, and problem list.  Review of Systems Pertinent items are noted in HPI.   Objective:   Blood pressure (!) 137/92, pulse 85, weight 298 lb 4.8 oz (135.3 kg), last menstrual period 06/09/2023. Body mass index is 51.2 kg/m. Well nourished, well hydrated Black female, no apparent distress She is ambulating and conversing normally. BMI 51 EG- some excoriations (?from itching) on her upper labia minora) I obtained a pap smear.   UPT negative, consent signed, time out done Cervix prepped with betadine and sprayed with Hurricaine spray. I then grasped with a single tooth tenaculum. Uterus sounded to 9 cm Pipelle used for 2 passes with tissue and mucous obtained. She tolerated the procedure well.  Bimanual exam revealed a uterus NSSA, no adnexal masses or tenderness Cervix prepped with betadine and Hurricaine spray and then grasped with a single tooth tenaculum. Mirena was easily placed and the strings were cut to 3-4 cm. Uterus sounded to 8 cm. She tolerated the procedure well.     Assessment:   1. Vaginal itching   2. Screening for cervical cancer   3.  Preventative health care   4. Menorrhagia with irregular cycle   5. Uterine hyperplasia      Plan:   1. Screening for cervical cancer  - Cytology - PAP  2. Preventative health care  - Cytology - PAP  3. Menorrhagia with irregular cycle  - Surgical pathology - Mirena IUD  4. Uterine hyperplasia  - Surgical pathology  5. Vaginal itching  - Cervicovaginal ancillary only  She will come back for a string check in 4 weeks.

## 2023-08-23 NOTE — Addendum Note (Signed)
Addended by: Burtis Junes on: 08/23/2023 09:42 AM   Modules accepted: Orders

## 2023-08-24 ENCOUNTER — Other Ambulatory Visit: Payer: Self-pay | Admitting: Obstetrics & Gynecology

## 2023-08-24 ENCOUNTER — Telehealth: Payer: Self-pay

## 2023-08-24 DIAGNOSIS — B379 Candidiasis, unspecified: Secondary | ICD-10-CM

## 2023-08-24 LAB — SURGICAL PATHOLOGY

## 2023-08-24 LAB — CERVICOVAGINAL ANCILLARY ONLY
Bacterial Vaginitis (gardnerella): NEGATIVE
Candida Glabrata: POSITIVE — AB
Candida Vaginitis: NEGATIVE
Comment: NEGATIVE
Comment: NEGATIVE
Comment: NEGATIVE

## 2023-08-24 MED ORDER — FLUCONAZOLE 150 MG PO TABS: 150.0000 mg | ORAL_TABLET | Freq: Once | ORAL | 0 refills | Status: DC

## 2023-08-24 NOTE — Progress Notes (Signed)
Diflucan prescribed to treat c. glabrata

## 2023-08-24 NOTE — Telephone Encounter (Signed)
-----   Message from Allie Bossier sent at 08/24/2023  1:00 PM EST ----- Please let her know that I prescribed diflucan to treat her yeast infection. Thanks

## 2023-08-24 NOTE — Telephone Encounter (Signed)
Called pt, no answer, LVMTRC. 

## 2023-08-25 LAB — CYTOLOGY - PAP
Comment: NEGATIVE
Diagnosis: NEGATIVE
High risk HPV: NEGATIVE

## 2023-08-30 MED ORDER — FLUCONAZOLE 150 MG PO TABS
150.0000 mg | ORAL_TABLET | Freq: Once | ORAL | 0 refills | Status: AC
Start: 2023-08-30 — End: 2023-08-30

## 2023-08-30 NOTE — Addendum Note (Signed)
Addended by: Kathlene Cote on: 08/30/2023 09:07 AM   Modules accepted: Orders

## 2023-08-30 NOTE — Telephone Encounter (Signed)
Patient requesting refill of Diflucan (1 never works for her). Advised will send refill.

## 2023-09-30 ENCOUNTER — Encounter: Payer: Self-pay | Admitting: Obstetrics & Gynecology

## 2023-10-14 ENCOUNTER — Ambulatory Visit: Payer: BC Managed Care – PPO | Admitting: Obstetrics & Gynecology

## 2023-10-14 VITALS — BP 140/79 | HR 72 | Ht 64.0 in | Wt 306.0 lb

## 2023-10-14 DIAGNOSIS — N938 Other specified abnormal uterine and vaginal bleeding: Secondary | ICD-10-CM

## 2023-10-14 DIAGNOSIS — N921 Excessive and frequent menstruation with irregular cycle: Secondary | ICD-10-CM

## 2023-10-14 MED ORDER — MEGESTROL ACETATE 40 MG PO TABS
40.0000 mg | ORAL_TABLET | Freq: Two times a day (BID) | ORAL | 5 refills | Status: DC
Start: 2023-10-14 — End: 2024-04-17

## 2023-10-14 NOTE — Progress Notes (Signed)
    GYNECOLOGY PROGRESS NOTE  Subjective:    Patient ID: Robin Hammond, female    DOB: [birth year], 48 y.o.   MRN: [id number]  HPI  Patient is a 48 y.o. single G0 here for follow up of heavy irregular periods. Her hx is as follows:  She had menarche around 48 yo and would only have a few periods per year. She has been treated with OCPs, depo provera throughout her lifetime. She had an EMBX at [place] around 2020 that showed simple hyperplasia. She was treated with a Mirena IUD. However she tells me that she had a follow up ultrasound and the IUD was not seen. I have scoured the notes and could not find this result or any note mentioning this. She was abstinent for about 20 years but started a sexual relationship about 9 months ago.  She report heavy long bleeding occasions, most recently for 2 weeks, very heavy.  I did an [facility] 07/2023 which was benign. I ordered an ultrasound that showed the following: The uterus is anteverted and measures 9.6 x 5.2  x 5.4 cm with a uterine volume of 143.4. Echo texture is heterogenous without evidence of focal masses.   The Endometrium measures 9.9 mm with a small cyst visualized in the canal. IUD is not visualized.   Right Ovary is not visualized  Left Ovary measures 2.1 x 2.0 x 1.1 cm. It is normal in appearance. Survey of the adnexa demonstrates no adnexal masses. There is no free fluid in the cul de sac.  TSH and CBC were normal 07/2023.  I placed a Mirena 08/23/2023.   Impression: 1. Simple cyst seen in the endometrial canal 2. IUD is not seen The following portions of the patient's history were reviewed and updated as appropriate: allergies, current medications, past family history, past medical history, past social history, past surgical history, and problem list.  Review of Systems Pertinent items are noted in HPI.  Pap normal 07/2023.  Objective:   There were no vitals taken for this visit. There is no height or weight on file to  calculate BMI. Well nourished, well hydrated  female, no apparent distress She is ambulating and conversing normally. Speculum exam reveals IUD string about 1.5 cm long and small amount of blood at os.    Assessment:   DUB- I think that Mirena is  the best option for her due to her h/o hyperplasia and increased risk of uterine cancer due to her weight.  Plan:   She will take megace 40 mg BID prn bleeding. She will schedule her mammogram.

## 2023-11-02 ENCOUNTER — Telehealth: Payer: Self-pay

## 2023-11-02 ENCOUNTER — Other Ambulatory Visit: Payer: Self-pay | Admitting: Licensed Practical Nurse

## 2023-11-02 DIAGNOSIS — B3731 Acute candidiasis of vulva and vagina: Secondary | ICD-10-CM

## 2023-11-02 MED ORDER — FLUCONAZOLE 150 MG PO TABS
150.0000 mg | ORAL_TABLET | ORAL | 1 refills | Status: AC
Start: 2023-11-02 — End: 2023-11-06

## 2023-11-02 NOTE — Telephone Encounter (Signed)
 Reviewed her chart, she can receive a prescription with 3 refills for the pill for an active infection (to take every 3 days until infection is clear).  Also once a week, she can try using boric acid suppositories or use Monistat to keep future infections from occurring.

## 2023-11-02 NOTE — Progress Notes (Signed)
Pt taking Megace, reports yeasts infections with this medication, Pt requesting Diflucan Prescription sent Jannifer Hick  [facility]  11/02/23  2:43 PM

## 2023-11-02 NOTE — Telephone Encounter (Signed)
 TRIAGE VOICEMAIL: Patient states the megestrol  (MEGACE ) 40 MG tablet she is on is working, but after taking it she gets a yeast infection. Dr. Starla has given her diflucan  in the past. Patient requesting rx for diflucan . She is using cream, but only able to use that at night and prefers pill.

## 2023-11-03 NOTE — Telephone Encounter (Signed)
Spoke with patient. Advised of response from [facility] and Dr. Valentino Saxon. Patient request that it be noted. She doesn't use Monistat or Boric Acid stating that it is too strong and causes her irritation. Aware rx sent.

## 2024-01-06 ENCOUNTER — Encounter: Payer: Self-pay | Admitting: Obstetrics

## 2024-01-06 ENCOUNTER — Other Ambulatory Visit (HOSPITAL_COMMUNITY)
Admission: RE | Admit: 2024-01-06 | Discharge: 2024-01-06 | Disposition: A | Discharge status: Home / Self Care | Source: Ambulatory Visit | Attending: Obstetrics | Admitting: Obstetrics

## 2024-01-06 ENCOUNTER — Ambulatory Visit: Admitting: Obstetrics

## 2024-01-06 VITALS — BP 117/73 | HR 105 | Ht 64.0 in | Wt 293.0 lb

## 2024-01-06 DIAGNOSIS — T8332XA Displacement of intrauterine contraceptive device, initial encounter: Secondary | ICD-10-CM

## 2024-01-06 DIAGNOSIS — Z113 Encounter for screening for infections with a predominantly sexual mode of transmission: Secondary | ICD-10-CM | POA: Insufficient documentation

## 2024-01-06 DIAGNOSIS — N898 Other specified noninflammatory disorders of vagina: Secondary | ICD-10-CM | POA: Diagnosis present

## 2024-01-06 DIAGNOSIS — Z30431 Encounter for routine checking of intrauterine contraceptive device: Secondary | ICD-10-CM | POA: Diagnosis not present

## 2024-01-06 DIAGNOSIS — B3731 Acute candidiasis of vulva and vagina: Secondary | ICD-10-CM

## 2024-01-06 MED ORDER — FLUCONAZOLE 150 MG PO TABS
150.0000 mg | ORAL_TABLET | Freq: Once | ORAL | 0 refills | Status: DC
Start: 2024-01-06 — End: 2024-01-06

## 2024-01-06 MED ORDER — FLUCONAZOLE 150 MG PO TABS
150.0000 mg | ORAL_TABLET | ORAL | 2 refills | Status: DC
Start: 2024-01-06 — End: 2024-04-17

## 2024-01-06 NOTE — Progress Notes (Signed)
    GYNECOLOGY PROGRESS NOTE  Subjective:  PCP: [facility]  Patient ID: Robin Hammond, female    DOB: 09/01/1976, 48 y.o.   MRN: [id number]  HPI  Patient is a 48 y.o. G0P0000 female who presents for IUD Check, her partner states he can feel the IUD poking him. This occurred in March '25 sometime.  She thinks it may have moved and hasn't felt the strings. Has hx of prior Mirena that spontaneously expelled, came in for check, no strings, [place] confirmed IUD not present. This happened last Nov '24 and had this new Mirena inserted 08/23/23.   Per prior visits w/Dr. Marice Potter, menarche at 48yo, oligomenorrheic. Treated with OCPs and DMPA. EMB at [facility] approx 2020 showed "simple hyperplasia" and was treated with Mirena IUD. Despite Mirena, continued to bleed heavily. EMB with Dr. Marice Potter 07/2023 was benign and pelvic US showed IUD was not there. New Mirena placed 08/23/23 and pt started on Megace 40mg  BID. If she misses one day of medication, the bleeding and cramping resume. If she is diligent and takes the medication, she is amenorrheic.  Pt feels the Megace is predisposing her to yeast infections. She responds well to Dilfucan. Would like to be swabbed today. Is interested in suppresive therapy, also has T2DM which also predisposes her to candidiasis.   The following portions of the patient's history were reviewed and updated as appropriate: allergies, current medications, past family history, past medical history, past social history, past surgical history, and problem list.  Review of Systems Pertinent items are noted in HPI.   Objective:   Blood pressure 117/73, pulse (!) 105, height 5\' 4"  (1.626 m), weight 293 lb (132.9 kg). Body mass index is 50.29 kg/m.  General appearance: alert, cooperative, and morbidly obese Abdomen: soft, non-tender; bowel sounds normal; no masses,  no organomegaly Pelvic: cervix normal in appearance, external genitalia normal, no adnexal masses or tenderness,  no cervical motion tenderness, rectovaginal septum normal, uterus normal size, shape, and consistency, and vagina normal without discharge. IUD strings not visualized. Unable to tease into os with cytobrush.  Extremities: extremities normal, atraumatic, no cyanosis or edema Neurologic: Grossly normal  Assessment/Plan:   1. IUD check up   2. Intrauterine contraceptive device threads lost, initial encounter   3. Vaginal itching   4. Recurrent candidiasis of vagina   Robin Hammond is a 48 y.o. G0P0000 with PMH of endometrial hyperplasia w/o atypia and HMB, most recent EMB 07/2023 benign, and well-controlled on Mirena IUD w/daily Megage 40mg  BID. She continues to have almost weekly symptoms of yeast infection due to the Megage and her T2DM. The IUD strings were not seen today on exam and unable to be brought into the os with a cytobrush. She spontaneously expelled a prior Mirena without realizing it within the past year.  -[place] for IUD location; if not present, RTC for reinsertion. -Swab done for yeast/BV today, treat pending results  -Start suppressive therapy for recurrent yeast infections -- Rx for Diflucan 150mg  weekly x 6 mos  Pt states she desires to follow up with Dr. Marice Potter when she is back in clinic. Will call her with [place] and swab results, and next treatment steps if any.    Julieanne Manson, DO [facility]

## 2024-01-11 LAB — CERVICOVAGINAL ANCILLARY ONLY
Bacterial Vaginitis (gardnerella): NEGATIVE
Candida Glabrata: NEGATIVE
Candida Vaginitis: NEGATIVE
Chlamydia: NEGATIVE
Comment: NEGATIVE
Comment: NEGATIVE
Comment: NEGATIVE
Comment: NEGATIVE
Comment: NEGATIVE
Comment: NORMAL
Neisseria Gonorrhea: NEGATIVE
Trichomonas: NEGATIVE

## 2024-01-20 ENCOUNTER — Ambulatory Visit
Admission: RE | Admit: 2024-01-20 | Discharge: 2024-01-20 | Disposition: A | Discharge status: Home / Self Care | Source: Ambulatory Visit | Attending: Obstetrics | Admitting: Obstetrics

## 2024-01-20 DIAGNOSIS — Z30431 Encounter for routine checking of intrauterine contraceptive device: Secondary | ICD-10-CM | POA: Diagnosis present

## 2024-01-24 NOTE — Progress Notes (Signed)
 Called pt she was with a client and could not speak, wanted me to call back later.

## 2024-01-26 NOTE — Progress Notes (Signed)
Pt aware.

## 2024-02-09 ENCOUNTER — Encounter: Payer: Self-pay | Admitting: Obstetrics & Gynecology

## 2024-02-09 ENCOUNTER — Ambulatory Visit: Admitting: Obstetrics & Gynecology

## 2024-02-09 VITALS — BP 93/61 | HR 111 | Ht 66.0 in | Wt 285.0 lb

## 2024-02-09 DIAGNOSIS — N939 Abnormal uterine and vaginal bleeding, unspecified: Secondary | ICD-10-CM | POA: Diagnosis not present

## 2024-02-09 DIAGNOSIS — T8332XA Displacement of intrauterine contraceptive device, initial encounter: Secondary | ICD-10-CM | POA: Diagnosis not present

## 2024-02-09 DIAGNOSIS — N921 Excessive and frequent menstruation with irregular cycle: Secondary | ICD-10-CM

## 2024-02-09 DIAGNOSIS — N9489 Other specified conditions associated with female genital organs and menstrual cycle: Secondary | ICD-10-CM

## 2024-02-09 MED ORDER — IBUPROFEN 600 MG PO TABS: 600.0000 mg | ORAL_TABLET | Freq: Four times a day (QID) | ORAL | 1 refills | Status: AC | PRN

## 2024-02-09 NOTE — Progress Notes (Signed)
    GYNECOLOGY PROGRESS NOTE  Subjective:    Patient ID: Robin Hammond, female    DOB: December 11, 1975, 48 y.o.   MRN: [id number]  HPI  Patient is a 48 y.o. G0P0000 here today for follow up. She was first seen here 07/2023 with the issue of DUB. She gave a h/o uterine hyperplasia. I placed a Mirena  IUD and repeated the EMBX and got a normal result. She was also given megace  40 mg BID to help with her bleeding. Of note, her TSH was normal and her HBG was 12.8. She saw Dr. Dell Fennel 01/06/2024 and told her that as long as she takes the megace  then she is amenorrheic. If she misses a dose she bleeds and her cramping increases.   She has DM and chronic yeast infections. Per Dr. Raechel Bulla note, the patient felt that megace  was contributing to the yeast so she stopped taking it. She stopped taking it and began to bleed 2 weeks ago and continues to bleed. She reports heavy bleeding with clots and lots of pain. She has only taken tylenol  with no help.  She was seen by her [facility] primary care provider today who drew labs including cbc (still pending as of 1600 today).  Dr. Dell Fennel ordered an ultrasound and it was done 01/24/2024 because she could not see the IUD strings. The IUD was noted to be in good position and the ultrasound was read as normal.  The following portions of the patient's history were reviewed and updated as appropriate: allergies, current medications, past family history, past medical history, past social history, past surgical history, and problem list.  Review of Systems Pertinent items are noted in HPI.  Pap normal 07/2023.  Objective:   Blood pressure 93/61, pulse (!) 111, height 5\' 6"  (1.676 m), weight 285 lb (129.3 kg), last menstrual period 01/25/2024. Body mass index is 46 kg/m. Well nourished, well hydrated Black female, no apparent distress She is ambulating and conversing normally. Spec exam reveal no active bleeding although there is some blood on the Chux pad. The IUD strings were not  visible. She wants the IUD removed as she believes that it is the cause of her cramping. I sprayed her cervix with Hurricaine spray and prepped the cervix with betadine. I then grasped the anterior lip with a single tooth tenaculum. I used the metal  IUD hook and then the plastic SMB IUD thread retriever. I could not find the IUD strings and so could not remove it. She tolerated the procedure well.  Assessment:  Retained IUD- will need to be removed in the OR  DUB with normal recent EMBX. She may be interested in an ablation although she is also asking about a hysterectomy.  I suggest that she make an appt with Dr. Luster Salters, the gyn surgeon in the practice. In the meantime I have rec'd that she restart megace  40 mg BID. I will prescribe IBU 600 mg prn cramping.  Plan:  As above

## 2024-02-09 NOTE — Patient Instructions (Signed)
 Endometrial Ablation: What to Expect An endometrial ablation is a procedure that removes the thin layer of the lining of the uterus. This lining is called the endometrium. This procedure may be done to: Stop heavy periods that can cause low red blood cell count (anemia). Try to stop irregular bleeding from your vagina. Treat bleeding that's caused by small tumors (fibroids) in the uterus. After this procedure, you may not be able to have children. Tell a health care provider about: Any allergies you have. All medicines you're taking. These include vitamins, herbs, eye drops, creams, and over-the-counter medicines. Any problems you or family members have had with anesthesia. Any bleeding problems you have. Any surgeries you have had. Any medical conditions you have. Whether you're pregnant or may be pregnant. What are the risks? Your health care provider will talk with you about risks. These may include: Bleeding. Infection. Allergic reactions to medicines. Injury to the uterus, bowel, or nearby structures. An air bubble in the lung (air embolus). Problems with pregnancy. Scar tissue. What happens before the procedure? Medicines Ask about changing or stopping: Any medicines you take. Any vitamins, herbs, or supplements you take. Do not take aspirin or ibuprofen unless you're told to. Tests You will have tests of your uterus to make sure that there's no cancer. You may have an ultrasound of the uterus. Surgery safety For your safety, you may: Need to wash your skin with a soap that kills germs. Get antibiotics. Have hair removed at the procedure site. General instructions Do not smoke, vape, or use nicotine or tobacco. You may be given medicines to thin the lining of the uterus. If you'll be going home right after the procedure, plan to have a responsible adult: Drive you home from the hospital or clinic. You won't be allowed to drive. Stay with you for the time you're  told. What happens during the procedure?  You will lie on an exam table with your feet and legs supported. An IV will be inserted into one of your veins. You will be given a sedative to help you relax. A surgical tool with a light and camera (resectoscope) will be inserted into your vagina and moved into your uterus. This allows your surgeon to see inside your uterus. The lining of your uterus will be taken out. Your surgeon will use one of these methods: Radiofrequency. This uses an electric current to destroy the lining of the uterus. Cryotherapy. This uses something very cold to freeze the lining of the uterus. Heated fluid. This uses very hot salt and water solution (saline) solution to destroy the lining of the uterus. Microwave. This uses high-energy waves to heat up the lining of the uterus and destroy it. Thermal balloon. A balloon filled with heated fluid is inserted into the uterus. The heated fluid destroys the lining of the uterus. These steps may vary. Ask what you can expect. What happens after the procedure? You may be watched closely until you leave. This includes checking your pain level, blood pressure, heart rate, and breathing rate. If you were given a sedative, do not drive or use machines until you're told it's safe. A sedative can make you sleepy. This information is not intended to replace advice given to you by your health care provider. Make sure you discuss any questions you have with your health care provider. Document Revised: 03/02/2023 Document Reviewed: 03/02/2023 Elsevier Patient Education  2024 [employer].

## 2024-03-15 ENCOUNTER — Encounter: Payer: Self-pay | Admitting: Obstetrics and Gynecology

## 2024-03-15 ENCOUNTER — Ambulatory Visit: Admitting: Obstetrics and Gynecology

## 2024-03-15 VITALS — BP 126/77 | HR 76 | Ht 66.0 in | Wt 285.9 lb

## 2024-03-15 DIAGNOSIS — T8332XD Displacement of intrauterine contraceptive device, subsequent encounter: Secondary | ICD-10-CM | POA: Diagnosis not present

## 2024-03-15 DIAGNOSIS — N921 Excessive and frequent menstruation with irregular cycle: Secondary | ICD-10-CM | POA: Diagnosis not present

## 2024-03-15 DIAGNOSIS — T8332XA Displacement of intrauterine contraceptive device, initial encounter: Secondary | ICD-10-CM

## 2024-03-15 NOTE — Progress Notes (Signed)
 HPI:      [name] is a 48 y.o. G0P0000 who LMP was No LMP recorded. (Menstrual status: IUD).  Subjective:   She presents today to discuss her IUD and dysfunctional bleeding.  The IUD could not be removed because the strings could not be found. (Dr. Everardo Hitch) she has been bleeding irregularly with the IUD and is unhappy with it.  She is currently taking Megace  which seems to be controlling her bleeding.  Prior to receiving the IUD she was only having 2-3 periods over the last 3 years.  She had an endometrial biopsy which shows no endometrial hyperplasia or malignancy.  She says that she would be happy if she went back to have only having a few periods per year and still would like the IUD removed. She has been considering hysterectomy because she is tired of the bleeding and issues with this IUD.    Hx: The following portions of the patient's history were reviewed and updated as appropriate:             She  has a past medical history of Anxiety, Depression, Diabetes mellitus without complication (HCC), GERD (gastroesophageal reflux disease), Hemorrhoids, Low serum vitamin D , Obesity, and Sleep apnea. She does not have any pertinent problems on file. She  has a past surgical history that includes Breast reduction surgery; Tonsillectomy; and Colonoscopy with propofol  (N/A, 01/14/2016). Her family history includes Cancer in her maternal grandmother, paternal grandfather, and paternal grandmother; Diabetes in her mother; Hypertension in her mother. She  reports that she has been smoking cigarettes. She has never used smokeless tobacco. She reports current alcohol use of about 3.0 - 4.0 standard drinks of alcohol per week. She reports that she does not use drugs. She has a current medication list which includes the following prescription(s): albuterol , aspirin, b complex-folic acid, budesonide -formoterol , citalopram , fluconazole , glipizide, ibuprofen , ibuprofen , levonorgestrel , lisinopril,  megestrol , meloxicam , metformin , ondansetron , pantoprazole , pravastatin, semaglutide, and spacer/aero-hold chamber bags. She is allergic to oxycodone and hydrocodone.       Review of Systems:  Review of Systems  Constitutional: Denied constitutional symptoms, night sweats, recent illness, fatigue, fever, insomnia and weight loss.  Eyes: Denied eye symptoms, eye pain, photophobia, vision change and visual disturbance.  Ears/Nose/Throat/Neck: Denied ear, nose, throat or neck symptoms, hearing loss, nasal discharge, sinus congestion and sore throat.  Cardiovascular: Denied cardiovascular symptoms, arrhythmia, chest pain/pressure, edema, exercise intolerance, orthopnea and palpitations.  Respiratory: Denied pulmonary symptoms, asthma, pleuritic pain, productive sputum, cough, dyspnea and wheezing.  Gastrointestinal: Denied, gastro-esophageal reflux, melena, nausea and vomiting.  Genitourinary: See HPI for additional information.  Musculoskeletal: Denied musculoskeletal symptoms, stiffness, swelling, muscle weakness and myalgia.  Dermatologic: Denied dermatology symptoms, rash and scar.  Neurologic: Denied neurology symptoms, dizziness, headache, neck pain and syncope.  Psychiatric: Denied psychiatric symptoms, anxiety and depression.  Endocrine: Denied endocrine symptoms including hot flashes and night sweats.   Meds:   Current Outpatient Medications on File Prior to Visit  Medication Sig Dispense Refill   albuterol  (VENTOLIN  HFA) 108 (90 Base) MCG/ACT inhaler Inhale 2 puffs into the lungs every 4 (four) hours as needed for wheezing or shortness of breath. 8 g 0   ASPIRIN 81 PO Take by mouth.     B Complex-Folic Acid (B COMPLEX-VITAMIN B12 PO) B Complex-Vitamin B12     budesonide -formoterol  (SYMBICORT ) 80-4.5 MCG/ACT inhaler Inhale 2 puffs into the lungs in the morning and at bedtime. 1 each 12   citalopram  (CELEXA ) 20 MG tablet citalopram  20  mg tablet     fluconazole  (DIFLUCAN ) 150 MG tablet  Take 1 tablet (150 mg total) by mouth once a week. 12 tablet 2   glipiZIDE (GLUCOTROL XL) 10 MG 24 hr tablet Take 1 tablet by mouth daily.     ibuprofen  (ADVIL ) 600 MG tablet Take 1 tablet (600 mg total) by mouth every 6 (six) hours as needed. 30 tablet 0   ibuprofen  (ADVIL ) 600 MG tablet Take 1 tablet (600 mg total) by mouth every 6 (six) hours as needed. 90 tablet 1   levonorgestrel  (MIRENA ) 20 MCG/DAY IUD 1 each by Intrauterine route once.     lisinopril (ZESTRIL) 2.5 MG tablet Take 10 mg by mouth daily.     megestrol  (MEGACE ) 40 MG tablet Take 1 tablet (40 mg total) by mouth 2 (two) times daily. 60 tablet 5   meloxicam  (MOBIC ) 15 MG tablet Take 1 tablet (15 mg total) by mouth daily as needed for pain. 30 tablet 0   metFORMIN  (GLUCOPHAGE ) 500 MG tablet metformin  500 mg tablet     ondansetron  (ZOFRAN ) 4 MG tablet Take 1 tablet (4 mg total) by mouth every 8 (eight) hours as needed for nausea or vomiting. 20 tablet 0   pantoprazole  (PROTONIX ) 40 MG tablet pantoprazole  40 mg tablet,delayed release     pravastatin (PRAVACHOL) 10 MG tablet Take 1 tablet by mouth at bedtime.     Semaglutide (OZEMPIC, 0.25 OR 0.5 MG/DOSE, [place]) Inject into the skin once a week.     Spacer/Aero-Hold Chamber [employer] Use as directed with albuterol  inhaler 1 Units 0   No current facility-administered medications on file prior to visit.      Objective:     Vitals:   03/15/24 1436  BP: 126/77  Pulse: 76   Filed Weights   03/15/24 1436  Weight: 285 lb 14.4 oz (129.7 kg)              Ultrasound shows IUD correctly located in the uterus.          Assessment:    G0P0000 Patient Active Problem List   Diagnosis Date Noted   Osteoarthritis of left knee 03/09/2023   Noninfectious diarrhea    Diabetes mellitus type 2, controlled, without complications (HCC) 12/03/2015   Vitamin D  deficiency 12/03/2015   Vitamin B12 deficiency 12/03/2015   Obesity, morbid, BMI 50 or higher (HCC) 12/02/2015   Depression  12/02/2015   FH: diabetes mellitus 12/02/2015   Bilateral low back pain without sciatica 12/02/2015   Acute pain of left knee 12/02/2015   Fecal incontinence 12/02/2015     1. Menorrhagia with irregular cycle   2. Intrauterine contraceptive device threads lost, initial encounter     Patient having irregular bleeding with an IUD and she is not happy with this.  Her IUD strings are lost.   Plan:            1.  We have discussed the option of IUD removal in the OR because she could not tolerate any further manipulation in the office and the strings could not be found.  The expectation would then be to go on progesterone for cycle control and birth control.  Likely Micronor as she has not become sexually active.  Would consider higher dose progestins and cycling if Micronor does not control bleeding.  2.  We have briefly discussed hysterectomy and at this time I do not recommend this for her because of her increased BMI and her likely proximity to menopause.  She will plan to schedule a preop for IUD removal. Orders No orders of the defined types were placed in this encounter.   No orders of the defined types were placed in this encounter.     F/U  No follow-ups on file.  Delice Felt, M.D. 03/15/2024 4:18 PM

## 2024-03-15 NOTE — Progress Notes (Signed)
 Patient presents today to discuss surgical options. Currently she has lost IUD threads and has failed a removal once, normal ultrasound completed. Additionally she has had abnormal bleeding, currently taking Megace  and has stopped bleeding. States ultimately she would like a hysterectomy.

## 2024-03-17 ENCOUNTER — Ambulatory Visit
Admission: EM | Admit: 2024-03-17 | Discharge: 2024-03-17 | Disposition: A | Discharge status: Home / Self Care | Attending: Physician Assistant | Admitting: Physician Assistant

## 2024-03-17 ENCOUNTER — Encounter: Payer: Self-pay | Admitting: Emergency Medicine

## 2024-03-17 DIAGNOSIS — N3000 Acute cystitis without hematuria: Secondary | ICD-10-CM | POA: Diagnosis present

## 2024-03-17 DIAGNOSIS — R300 Dysuria: Secondary | ICD-10-CM | POA: Insufficient documentation

## 2024-03-17 LAB — URINALYSIS, W/ REFLEX TO CULTURE (INFECTION SUSPECTED)
Bilirubin Urine: NEGATIVE
Glucose, UA: NEGATIVE mg/dL
Hgb urine dipstick: NEGATIVE
Ketones, ur: NEGATIVE mg/dL
Nitrite: POSITIVE — AB
Specific Gravity, Urine: >1.03 — ABNORMAL HIGH (ref 1.005–1.030)
pH: 6 (ref 5.0–8.0)

## 2024-03-17 MED ORDER — NITROFURANTOIN MONOHYD MACRO 100 MG PO CAPS: 100.0000 mg | ORAL_CAPSULE | Freq: Two times a day (BID) | ORAL | 0 refills | Status: DC

## 2024-03-17 NOTE — Discharge Instructions (Signed)

## 2024-03-17 NOTE — ED Triage Notes (Signed)
 Patient c/o dysuria and lower abdominal pressure and pain and urinary frequency that started about a week.

## 2024-03-17 NOTE — ED Provider Notes (Signed)
 [facility]    CSN: [id number] Arrival date & time: 03/17/24  0917      History   Chief Complaint Chief Complaint  Patient presents with   Dysuria    HPI Robin Hammond is a 48 y.o. female presenting for dysuria, lower abdominal pressure, frequency for about a week.  Denies hematuria, vaginal discharge, flank pain or fever.  Patient believes she may have a urinary tract infection.  She has tried increasing her fluids without relief.  HPI  Past Medical History:  Diagnosis Date   Anxiety    Depression    Diabetes mellitus without complication (HCC)    GERD (gastroesophageal reflux disease)    Hemorrhoids    Low serum vitamin D     Obesity    Sleep apnea     Patient Active Problem List   Diagnosis Date Noted   Osteoarthritis of left knee 03/09/2023   Noninfectious diarrhea    Diabetes mellitus type 2, controlled, without complications (HCC) 12/03/2015   Vitamin D  deficiency 12/03/2015   Vitamin B12 deficiency 12/03/2015   Obesity, morbid, BMI 50 or higher (HCC) 12/02/2015   Depression 12/02/2015   FH: diabetes mellitus 12/02/2015   Bilateral low back pain without sciatica 12/02/2015   Acute pain of left knee 12/02/2015   Fecal incontinence 12/02/2015    Past Surgical History:  Procedure Laterality Date   BREAST REDUCTION SURGERY     COLONOSCOPY WITH PROPOFOL  N/A 01/14/2016   Procedure: COLONOSCOPY WITH PROPOFOL ;  Surgeon: Marnee Sink, MD;  Location: [facility] ENDOSCOPY;  Service: Endoscopy;  Laterality: N/A;   TONSILLECTOMY      OB History     Gravida  0   Para  0   Term  0   Preterm  0   AB  0   Living  0      SAB  0   IAB  0   Ectopic  0   Multiple  0   Live Births  0            Home Medications    Prior to Admission medications   Medication Sig Start Date End Date Taking? Authorizing Provider  nitrofurantoin, macrocrystal-monohydrate, (MACROBID) 100 MG capsule Take 1 capsule (100 mg total) by mouth 2 (two) times daily.  03/17/24  Yes Floydene Hy, PA-C  albuterol  (VENTOLIN  HFA) 108 (90 Base) MCG/ACT inhaler Inhale 2 puffs into the lungs every 4 (four) hours as needed for wheezing or shortness of breath. 09/17/22   Siadecki, Sebastian, MD  ASPIRIN 81 PO Take by mouth.    [provider]  B Complex-Folic Acid (B COMPLEX-VITAMIN B12 PO) B Complex-Vitamin B12    [provider]  budesonide -formoterol  (SYMBICORT ) 80-4.5 MCG/ACT inhaler Inhale 2 puffs into the lungs in the morning and at bedtime. 10/09/22   Brimage, Vondra, DO  citalopram  (CELEXA ) 20 MG tablet citalopram  20 mg tablet    [provider]  fluconazole  (DIFLUCAN ) 150 MG tablet Take 1 tablet (150 mg total) by mouth once a week. 01/06/24   Sofia Dunn, MD  glipiZIDE (GLUCOTROL XL) 10 MG 24 hr tablet Take 1 tablet by mouth daily. 12/30/21 03/15/24  [provider]  ibuprofen  (ADVIL ) 600 MG tablet Take 1 tablet (600 mg total) by mouth every 6 (six) hours as needed. 09/17/22   Siadecki, Sebastian, MD  ibuprofen  (ADVIL ) 600 MG tablet Take 1 tablet (600 mg total) by mouth every 6 (six) hours as needed. 02/09/24   Dove, Myra C, MD  levonorgestrel  (MIRENA ) 20 MCG/DAY IUD 1 each by Intrauterine route once.    [provider]  lisinopril (ZESTRIL) 2.5 MG tablet Take 10 mg by mouth daily.    [provider]  megestrol  (MEGACE ) 40 MG tablet Take 1 tablet (40 mg total) by mouth 2 (two) times daily. 10/14/23   Dove, Myra C, MD  meloxicam  (MOBIC ) 15 MG tablet Take 1 tablet (15 mg total) by mouth daily as needed for pain. 10/29/18   Cook, Jayce G, DO  metFORMIN  (GLUCOPHAGE ) 500 MG tablet metformin  500 mg tablet    [provider]  ondansetron  (ZOFRAN ) 4 MG tablet Take 1 tablet (4 mg total) by mouth every 8 (eight) hours as needed for nausea or vomiting. 10/29/18   Cook, Jayce G, DO  pantoprazole  (PROTONIX ) 40 MG tablet pantoprazole  40 mg tablet,delayed release    [provider]  pravastatin (PRAVACHOL) 10 MG  tablet Take 1 tablet by mouth at bedtime. 06/08/22   [provider]  Semaglutide (OZEMPIC, 0.25 OR 0.5 MG/DOSE, [place]) Inject into the skin once a week.    [provider]  Spacer/Aero-Hold Chamber Bags MISC Use as directed with albuterol  inhaler 09/17/22   Lind Repine, MD    Family History Family History  Problem Relation Age of Onset   Diabetes Mother    Hypertension Mother    Cancer Maternal Grandmother    Cancer Paternal Grandmother    Cancer Paternal Grandfather     Social History Social History   Tobacco Use   Smoking status: Every Day    Current packs/day: 0.30    Types: Cigarettes   Smokeless tobacco: Never  Vaping Use   Vaping status: Former  Substance Use Topics   Alcohol use: Yes    Alcohol/week: 3.0 - 4.0 standard drinks of alcohol    Types: 3 - 4 Standard drinks or equivalent per week   Drug use: No     Allergies   Oxycodone and Hydrocodone   Review of Systems Review of Systems  Constitutional:  Negative for chills and fever.  Gastrointestinal:  Positive for abdominal pain. Negative for diarrhea, nausea and vomiting.  Genitourinary:  Positive for dysuria, frequency and urgency. Negative for decreased urine volume, flank pain, hematuria, pelvic pain, vaginal bleeding, vaginal discharge and vaginal pain.  Musculoskeletal:  Negative for back pain.  Skin:  Negative for rash.     Physical Exam Triage Vital Signs ED Triage Vitals  Encounter Vitals Group     BP 03/17/24 0930 120/81     Girls Systolic BP Percentile --      Girls Diastolic BP Percentile --      Boys Systolic BP Percentile --      Boys Diastolic BP Percentile --      Pulse Rate 03/17/24 0930 79     Resp 03/17/24 0930 14     Temp 03/17/24 0930 98.7 F (37.1 C)     Temp Source 03/17/24 0930 Oral     SpO2 03/17/24 0930 96 %     Weight 03/17/24 0928 285 lb 15 oz (129.7 kg)     Height 03/17/24 0928 5' 6 (1.676 m)     Head Circumference --      Peak Flow --       Pain Score 03/17/24 0928 7     Pain Loc --      Pain Education --      Exclude from Growth Chart --    No data found.  Updated Vital  Signs BP 120/81 (BP Location: Right Arm)   Pulse 79   Temp 98.7 F (37.1 C) (Oral)   Resp 14   Ht 5' 6 (1.676 m)   Wt 285 lb 15 oz (129.7 kg)   SpO2 96%   BMI 46.15 kg/m   Visual Acuity Right Eye Distance:   Left Eye Distance:   Bilateral Distance:    Right Eye Near:   Left Eye Near:    Bilateral Near:     Physical Exam Vitals and nursing note reviewed.  Constitutional:      General: She is not in acute distress.    Appearance: Normal appearance. She is not ill-appearing or toxic-appearing.  HENT:     Head: Normocephalic and atraumatic.   Eyes:     General: No scleral icterus.       Right eye: No discharge.        Left eye: No discharge.     Conjunctiva/sclera: Conjunctivae normal.    Cardiovascular:     Rate and Rhythm: Normal rate and regular rhythm.     Heart sounds: Normal heart sounds.  Pulmonary:     Effort: Pulmonary effort is normal. No respiratory distress.     Breath sounds: Normal breath sounds.  Abdominal:     Palpations: Abdomen is soft.     Tenderness: There is no abdominal tenderness. There is no right CVA tenderness or left CVA tenderness.   Musculoskeletal:     Cervical back: Neck supple.   Skin:    General: Skin is dry.   Neurological:     General: No focal deficit present.     Mental Status: She is alert. Mental status is at baseline.     Motor: No weakness.     Gait: Gait normal.   Psychiatric:        Mood and Affect: Mood normal.        Behavior: Behavior normal.      UC Treatments / Results  Labs (all labs ordered are listed, but only abnormal results are displayed) Labs Reviewed  URINALYSIS, W/ REFLEX TO CULTURE (INFECTION SUSPECTED) - Abnormal; Notable for the following components:      Result Value   APPearance HAZY (*)    Specific Gravity, Urine >1.030 (*)    Protein, ur TRACE (*)     Nitrite POSITIVE (*)    Leukocytes,Ua TRACE (*)    Bacteria, UA MANY (*)    All other components within normal limits  URINE CULTURE    EKG   Radiology No results found.  Procedures Procedures (including critical care time)  Medications Ordered in UC Medications - No data to display  Initial Impression / Assessment and Plan / UC Course  I have reviewed the triage vital signs and the nursing notes.  Pertinent labs & imaging results that were available during my care of the patient were reviewed by me and considered in my medical decision making (see chart for details).   48 year old female presents for dysuria, frequency and urgency x 1 week.  Urinalysis obtained today shows >1.030 specific gravity, trace protein, +nitrites, trace leuks, and many bacteria. Urine sent for culture.   UTI.  Sent Macrobid to pharmacy.  Encouraged increasing rest and fluids.  Will amend treatment based on culture if needed.  Reviewed return and ER precautions.   Final Clinical Impressions(s) / UC Diagnoses   Final diagnoses:  Acute cystitis without hematuria  Dysuria     Discharge Instructions  UTI: Based on either symptoms or urinalysis, you may have a urinary tract infection. We will send the urine for culture and call with results in a few days. Begin antibiotics at this time. Your symptoms should be much improved over the next 2-3 days. Increase rest and fluid intake. If for some reason symptoms are worsening or not improving after a couple of days or the urine culture determines the antibiotics you are taking will not treat the infection, the antibiotics may be changed. Return or go to ER for fever, back pain, worsening urinary pain, discharge, increased blood in urine. May take Tylenol  or Motrin  OTC for pain relief or consider AZO if no contraindications     ED Prescriptions     Medication Sig Dispense Auth. Provider   nitrofurantoin, macrocrystal-monohydrate, (MACROBID) 100  MG capsule Take 1 capsule (100 mg total) by mouth 2 (two) times daily. 10 capsule Floydene Hy, PA-C      PDMP not reviewed this encounter.   Floydene Hy, PA-C 03/17/24 1009

## 2024-03-19 LAB — URINE CULTURE: Culture: >=100000 — AB

## 2024-03-20 ENCOUNTER — Ambulatory Visit (HOSPITAL_COMMUNITY): Payer: Self-pay

## 2024-03-21 MED ORDER — FLUCONAZOLE 150 MG PO TABS: 150.0000 mg | ORAL_TABLET | Freq: Once | ORAL | 0 refills | Status: AC

## 2024-03-25 ENCOUNTER — Other Ambulatory Visit: Payer: Self-pay | Admitting: Obstetrics & Gynecology

## 2024-04-13 ENCOUNTER — Ambulatory Visit: Admitting: Obstetrics and Gynecology

## 2024-04-13 ENCOUNTER — Encounter: Payer: Self-pay | Admitting: Obstetrics and Gynecology

## 2024-04-13 VITALS — BP 112/79 | HR 76 | Ht 66.0 in | Wt 282.8 lb

## 2024-04-13 DIAGNOSIS — Z01818 Encounter for other preprocedural examination: Secondary | ICD-10-CM | POA: Diagnosis not present

## 2024-04-13 DIAGNOSIS — N921 Excessive and frequent menstruation with irregular cycle: Secondary | ICD-10-CM

## 2024-04-13 DIAGNOSIS — T8332XA Displacement of intrauterine contraceptive device, initial encounter: Secondary | ICD-10-CM

## 2024-04-13 NOTE — Progress Notes (Signed)
 PRE-OPERATIVE HISTORY AND PHYSICAL EXAM  PCP:  [facility] Subjective:   HPI:  Robin Hammond is a 48 y.o. G0P0000.  No LMP recorded. (Menstrual status: IUD).  She presents today for a pre-op discussion and PE.  She has the following symptoms: Lost IUD  Review of Systems:   Constitutional: Denied constitutional symptoms, night sweats, recent illness, fatigue, fever, insomnia and weight loss.  Eyes: Denied eye symptoms, eye pain, photophobia, vision change and visual disturbance.  Ears/Nose/Throat/Neck: Denied ear, nose, throat or neck symptoms, hearing loss, nasal discharge, sinus congestion and sore throat.  Cardiovascular: Denied cardiovascular symptoms, arrhythmia, chest pain/pressure, edema, exercise intolerance, orthopnea and palpitations.  Respiratory: Denied pulmonary symptoms, asthma, pleuritic pain, productive sputum, cough, dyspnea and wheezing.  Gastrointestinal: Denied, gastro-esophageal reflux, melena, nausea and vomiting.  Genitourinary: Denied genitourinary symptoms including symptomatic vaginal discharge, pelvic relaxation issues, and urinary complaints.  Musculoskeletal: Denied musculoskeletal symptoms, stiffness, swelling, muscle weakness and myalgia.  Dermatologic: Denied dermatology symptoms, rash and scar.  Neurologic: Denied neurology symptoms, dizziness, headache, neck pain and syncope.  Psychiatric: Denied psychiatric symptoms, anxiety and depression.  Endocrine: Denied endocrine symptoms including hot flashes and night sweats.   OB History  Gravida Para Term Preterm AB Living  0 0 0 0 0 0  SAB IAB Ectopic Multiple Live Births  0 0 0 0 0    Past Medical History:  Diagnosis Date   Anxiety    Depression    Diabetes mellitus without complication (HCC)    GERD (gastroesophageal reflux disease)    Hemorrhoids    Low serum vitamin D     Obesity    Sleep apnea     Past Surgical History:  Procedure Laterality Date   BREAST REDUCTION  SURGERY     COLONOSCOPY WITH PROPOFOL  N/A 01/14/2016   Procedure: COLONOSCOPY WITH PROPOFOL ;  Surgeon: Rogelia Copping, MD;  Location: [facility];  Service: Endoscopy;  Laterality: N/A;   TONSILLECTOMY        SOCIAL HISTORY:  Social History   Tobacco Use  Smoking Status Every Day   Current packs/day: 0.30   Types: Cigarettes  Smokeless Tobacco Never   Social History   Substance and Sexual Activity  Alcohol Use Yes   Alcohol/week: 3.0 - 4.0 standard drinks of alcohol   Types: 3 - 4 Standard drinks or equivalent per week    Social History   Substance and Sexual Activity  Drug Use No    Family History  Problem Relation Age of Onset   Diabetes Mother    Hypertension Mother    Cancer Maternal Grandmother    Cancer Paternal Grandmother    Cancer Paternal Grandfather     ALLERGIES:  Oxycodone and Hydrocodone  MEDS:   Current Outpatient Medications on File Prior to Visit  Medication Sig Dispense Refill   albuterol  (VENTOLIN  HFA) 108 (90 Base) MCG/ACT inhaler Inhale 2 puffs into the lungs every 4 (four) hours as needed for wheezing or shortness of breath. 8 g 0   ASPIRIN 81 PO Take by mouth.     B Complex-Folic Acid (B COMPLEX-VITAMIN B12 PO) B Complex-Vitamin B12     budesonide -formoterol  (SYMBICORT ) 80-4.5 MCG/ACT inhaler Inhale 2 puffs into the lungs in the morning and at bedtime. 1 each 12   citalopram  (CELEXA ) 20 MG tablet citalopram  20 mg tablet     fluconazole  (DIFLUCAN ) 150 MG tablet Take 1 tablet (150 mg total) by mouth once a week. 12 tablet 2  glipiZIDE (GLUCOTROL XL) 10 MG 24 hr tablet Take 1 tablet by mouth daily.     ibuprofen  (ADVIL ) 600 MG tablet Take 1 tablet (600 mg total) by mouth every 6 (six) hours as needed. 30 tablet 0   ibuprofen  (ADVIL ) 600 MG tablet Take 1 tablet (600 mg total) by mouth every 6 (six) hours as needed. 90 tablet 1   levonorgestrel  (MIRENA ) 20 MCG/DAY IUD 1 each by Intrauterine route once.     lisinopril (ZESTRIL) 2.5 MG tablet  Take 10 mg by mouth daily.     megestrol  (MEGACE ) 40 MG tablet Take 1 tablet (40 mg total) by mouth 2 (two) times daily. 60 tablet 5   meloxicam  (MOBIC ) 15 MG tablet Take 1 tablet (15 mg total) by mouth daily as needed for pain. 30 tablet 0   metFORMIN  (GLUCOPHAGE ) 500 MG tablet metformin  500 mg tablet     nitrofurantoin , macrocrystal-monohydrate, (MACROBID ) 100 MG capsule Take 1 capsule (100 mg total) by mouth 2 (two) times daily. 10 capsule 0   ondansetron  (ZOFRAN ) 4 MG tablet Take 1 tablet (4 mg total) by mouth every 8 (eight) hours as needed for nausea or vomiting. 20 tablet 0   pantoprazole  (PROTONIX ) 40 MG tablet pantoprazole  40 mg tablet,delayed release     pravastatin (PRAVACHOL) 10 MG tablet Take 1 tablet by mouth at bedtime.     Semaglutide (OZEMPIC, 0.25 OR 0.5 MG/DOSE, [place]) Inject into the skin once a week.     Spacer/Aero-Hold Chamber [employer] Use as directed with albuterol  inhaler 1 Units 0   No current facility-administered medications on file prior to visit.    No orders of the defined types were placed in this encounter.    Physical examination BP 112/79   Pulse 76   Ht 5' 6 (1.676 m)   Wt 282 lb 12.8 oz (128.3 kg)   BMI 45.65 kg/m   General NAD, Conversant  HEENT Atraumatic; Op clear with mmm.  Normo-cephalic.  Anicteric sclerae  Thyroid/Neck Smooth without nodularity or enlargement. Normal ROM.  Neck Supple.  Skin No rashes, lesions or ulceration. Normal palpated skin turgor. No nodularity.  Breasts: No masses or discharge.  Symmetric.  No axillary adenopathy.  Lungs: Clear to auscultation.No rales or wheezes. Normal Respiratory effort, no retractions.  Heart: NSR.  No murmurs or rubs appreciated. No peripheral edema  Abdomen: Soft.  Non-tender.  No masses.  No HSM. No hernia  Extremities: Moves all appropriately.  Normal ROM for age. No lymphadenopathy.  Neuro: Oriented to PPT.  Normal mood. Normal affect.     Pelvic: Deferred to OR    Ultrasound shows IUD  intrauterine  Assessment:   G0P0000 Patient Active Problem List   Diagnosis Date Noted   Osteoarthritis of left knee 03/09/2023   Noninfectious diarrhea    Diabetes mellitus type 2, controlled, without complications (HCC) 12/03/2015   Vitamin D  deficiency 12/03/2015   Vitamin B12 deficiency 12/03/2015   Obesity, morbid, BMI 50 or higher (HCC) 12/02/2015   Depression 12/02/2015   FH: diabetes mellitus 12/02/2015   Bilateral low back pain without sciatica 12/02/2015   Acute pain of left knee 12/02/2015   Fecal incontinence 12/02/2015    1. Pre-op exam   2. Menorrhagia with irregular cycle   3. Intrauterine contraceptive device threads lost, initial encounter      Plan:   Orders: No orders of the defined types were placed in this encounter.    1.  IUD removal  Dr. Starla attempted IUD  removal in the office.  Patient had discomfort and Dr. Starla was unable to locate the IUD.  Ultrasound shows IUD currently in the uterus.  Patient would like OR IUD removal.   Patient was having irregular cycles and a negative endometrial biopsy prior to IUD placement.  She would like to be tested for menopause after her surgery.  Will plan a 10-day hold on hormones and attempt to draw an accurate FSH.  Would otherwise consider Micronor for birth control and cycle control if FSH in normal limits.     Alm DOROTHA Sar, M.D. 04/13/2024 9:46 AM

## 2024-04-13 NOTE — H&P (Signed)
 PRE-OPERATIVE HISTORY AND PHYSICAL EXAM  PCP:  [facility], [facility] Subjective:   HPI:  Robin Hammond is a 48 y.o. G0P0000.  No LMP recorded. (Menstrual status: IUD).  She presents today for a pre-op discussion and PE.  She has the following symptoms: Lost IUD  Review of Systems:   Constitutional: Denied constitutional symptoms, night sweats, recent illness, fatigue, fever, insomnia and weight loss.  Eyes: Denied eye symptoms, eye pain, photophobia, vision change and visual disturbance.  Ears/Nose/Throat/Neck: Denied ear, nose, throat or neck symptoms, hearing loss, nasal discharge, sinus congestion and sore throat.  Cardiovascular: Denied cardiovascular symptoms, arrhythmia, chest pain/pressure, edema, exercise intolerance, orthopnea and palpitations.  Respiratory: Denied pulmonary symptoms, asthma, pleuritic pain, productive sputum, cough, dyspnea and wheezing.  Gastrointestinal: Denied, gastro-esophageal reflux, melena, nausea and vomiting.  Genitourinary: Denied genitourinary symptoms including symptomatic vaginal discharge, pelvic relaxation issues, and urinary complaints.  Musculoskeletal: Denied musculoskeletal symptoms, stiffness, swelling, muscle weakness and myalgia.  Dermatologic: Denied dermatology symptoms, rash and scar.  Neurologic: Denied neurology symptoms, dizziness, headache, neck pain and syncope.  Psychiatric: Denied psychiatric symptoms, anxiety and depression.  Endocrine: Denied endocrine symptoms including hot flashes and night sweats.   OB History  Gravida Para Term Preterm AB Living  0 0 0 0 0 0  SAB IAB Ectopic Multiple Live Births  0 0 0 0 0    Past Medical History:  Diagnosis Date   Anxiety    Depression    Diabetes mellitus without complication (HCC)    GERD (gastroesophageal reflux disease)    Hemorrhoids    Low serum vitamin D     Obesity    Sleep apnea     Past Surgical History:  Procedure Laterality Date   BREAST REDUCTION  SURGERY     COLONOSCOPY WITH PROPOFOL  N/A 01/14/2016   Procedure: COLONOSCOPY WITH PROPOFOL ;  Surgeon: Rogelia Copping, MD;  Location: [facility];  Service: Endoscopy;  Laterality: N/A;   TONSILLECTOMY        SOCIAL HISTORY:  Social History   Tobacco Use  Smoking Status Every Day   Current packs/day: 0.30   Types: Cigarettes  Smokeless Tobacco Never   Social History   Substance and Sexual Activity  Alcohol Use Yes   Alcohol/week: 3.0 - 4.0 standard drinks of alcohol   Types: 3 - 4 Standard drinks or equivalent per week    Social History   Substance and Sexual Activity  Drug Use No    Family History  Problem Relation Age of Onset   Diabetes Mother    Hypertension Mother    Cancer Maternal Grandmother    Cancer Paternal Grandmother    Cancer Paternal Grandfather     ALLERGIES:  Oxycodone and Hydrocodone  MEDS:   Current Outpatient Medications on File Prior to Visit  Medication Sig Dispense Refill   albuterol  (VENTOLIN  HFA) 108 (90 Base) MCG/ACT inhaler Inhale 2 puffs into the lungs every 4 (four) hours as needed for wheezing or shortness of breath. 8 g 0   ASPIRIN 81 PO Take by mouth.     B Complex-Folic Acid (B COMPLEX-VITAMIN B12 PO) B Complex-Vitamin B12     budesonide -formoterol  (SYMBICORT ) 80-4.5 MCG/ACT inhaler Inhale 2 puffs into the lungs in the morning and at bedtime. 1 each 12   citalopram  (CELEXA ) 20 MG tablet citalopram  20 mg tablet     fluconazole  (DIFLUCAN ) 150 MG tablet Take 1 tablet (150 mg total) by mouth once a week. 12 tablet 2  glipiZIDE (GLUCOTROL XL) 10 MG 24 hr tablet Take 1 tablet by mouth daily.     ibuprofen  (ADVIL ) 600 MG tablet Take 1 tablet (600 mg total) by mouth every 6 (six) hours as needed. 30 tablet 0   ibuprofen  (ADVIL ) 600 MG tablet Take 1 tablet (600 mg total) by mouth every 6 (six) hours as needed. 90 tablet 1   levonorgestrel  (MIRENA ) 20 MCG/DAY IUD 1 each by Intrauterine route once.     lisinopril (ZESTRIL) 2.5 MG tablet  Take 10 mg by mouth daily.     megestrol  (MEGACE ) 40 MG tablet Take 1 tablet (40 mg total) by mouth 2 (two) times daily. 60 tablet 5   meloxicam  (MOBIC ) 15 MG tablet Take 1 tablet (15 mg total) by mouth daily as needed for pain. 30 tablet 0   metFORMIN  (GLUCOPHAGE ) 500 MG tablet metformin  500 mg tablet     nitrofurantoin , macrocrystal-monohydrate, (MACROBID ) 100 MG capsule Take 1 capsule (100 mg total) by mouth 2 (two) times daily. 10 capsule 0   ondansetron  (ZOFRAN ) 4 MG tablet Take 1 tablet (4 mg total) by mouth every 8 (eight) hours as needed for nausea or vomiting. 20 tablet 0   pantoprazole  (PROTONIX ) 40 MG tablet pantoprazole  40 mg tablet,delayed release     pravastatin (PRAVACHOL) 10 MG tablet Take 1 tablet by mouth at bedtime.     Semaglutide (OZEMPIC, 0.25 OR 0.5 MG/DOSE, [place]) Inject into the skin once a week.     Spacer/Aero-Hold Chamber [employer] Use as directed with albuterol  inhaler 1 Units 0   No current facility-administered medications on file prior to visit.    No orders of the defined types were placed in this encounter.    Physical examination BP 112/79   Pulse 76   Ht 5' 6 (1.676 m)   Wt 282 lb 12.8 oz (128.3 kg)   BMI 45.65 kg/m   General NAD, Conversant  HEENT Atraumatic; Op clear with mmm.  Normo-cephalic.  Anicteric sclerae  Thyroid/Neck Smooth without nodularity or enlargement. Normal ROM.  Neck Supple.  Skin No rashes, lesions or ulceration. Normal palpated skin turgor. No nodularity.  Breasts: No masses or discharge.  Symmetric.  No axillary adenopathy.  Lungs: Clear to auscultation.No rales or wheezes. Normal Respiratory effort, no retractions.  Heart: NSR.  No murmurs or rubs appreciated. No peripheral edema  Abdomen: Soft.  Non-tender.  No masses.  No HSM. No hernia  Extremities: Moves all appropriately.  Normal ROM for age. No lymphadenopathy.  Neuro: Oriented to PPT.  Normal mood. Normal affect.     Pelvic: Deferred to OR    Ultrasound shows IUD  intrauterine  Assessment:   G0P0000 Patient Active Problem List   Diagnosis Date Noted   Osteoarthritis of left knee 03/09/2023   Noninfectious diarrhea    Diabetes mellitus type 2, controlled, without complications (HCC) 12/03/2015   Vitamin D  deficiency 12/03/2015   Vitamin B12 deficiency 12/03/2015   Obesity, morbid, BMI 50 or higher (HCC) 12/02/2015   Depression 12/02/2015   FH: diabetes mellitus 12/02/2015   Bilateral low back pain without sciatica 12/02/2015   Acute pain of left knee 12/02/2015   Fecal incontinence 12/02/2015    1. Pre-op exam   2. Menorrhagia with irregular cycle   3. Intrauterine contraceptive device threads lost, initial encounter      Plan:   Orders: No orders of the defined types were placed in this encounter.    1.  IUD removal  Dr. Starla attempted IUD  removal in the office.  Patient had discomfort and Dr. Starla was unable to locate the IUD.  Ultrasound shows IUD currently in the uterus.  Patient would like OR IUD removal.   Patient was having irregular cycles and a negative endometrial biopsy prior to IUD placement.  She would like to be tested for menopause after her surgery.  Will plan a 10-day hold on hormones and attempt to draw an accurate FSH.  Would otherwise consider Micronor for birth control and cycle control if FSH in normal limits.

## 2024-04-13 NOTE — H&P (View-Only) (Signed)
 PRE-OPERATIVE HISTORY AND PHYSICAL EXAM  PCP:  [facility], [facility] Subjective:   HPI:  Robin Hammond is a 48 y.o. G0P0000.  No LMP recorded. (Menstrual status: IUD).  She presents today for a pre-op discussion and PE.  She has the following symptoms: Lost IUD  Review of Systems:   Constitutional: Denied constitutional symptoms, night sweats, recent illness, fatigue, fever, insomnia and weight loss.  Eyes: Denied eye symptoms, eye pain, photophobia, vision change and visual disturbance.  Ears/Nose/Throat/Neck: Denied ear, nose, throat or neck symptoms, hearing loss, nasal discharge, sinus congestion and sore throat.  Cardiovascular: Denied cardiovascular symptoms, arrhythmia, chest pain/pressure, edema, exercise intolerance, orthopnea and palpitations.  Respiratory: Denied pulmonary symptoms, asthma, pleuritic pain, productive sputum, cough, dyspnea and wheezing.  Gastrointestinal: Denied, gastro-esophageal reflux, melena, nausea and vomiting.  Genitourinary: Denied genitourinary symptoms including symptomatic vaginal discharge, pelvic relaxation issues, and urinary complaints.  Musculoskeletal: Denied musculoskeletal symptoms, stiffness, swelling, muscle weakness and myalgia.  Dermatologic: Denied dermatology symptoms, rash and scar.  Neurologic: Denied neurology symptoms, dizziness, headache, neck pain and syncope.  Psychiatric: Denied psychiatric symptoms, anxiety and depression.  Endocrine: Denied endocrine symptoms including hot flashes and night sweats.   OB History  Gravida Para Term Preterm AB Living  0 0 0 0 0 0  SAB IAB Ectopic Multiple Live Births  0 0 0 0 0    Past Medical History:  Diagnosis Date   Anxiety    Depression    Diabetes mellitus without complication (HCC)    GERD (gastroesophageal reflux disease)    Hemorrhoids    Low serum vitamin D     Obesity    Sleep apnea     Past Surgical History:  Procedure Laterality Date   BREAST REDUCTION  SURGERY     COLONOSCOPY WITH PROPOFOL  N/A 01/14/2016   Procedure: COLONOSCOPY WITH PROPOFOL ;  Surgeon: Rogelia Copping, MD;  Location: [facility];  Service: Endoscopy;  Laterality: N/A;   TONSILLECTOMY        SOCIAL HISTORY:  Social History   Tobacco Use  Smoking Status Every Day   Current packs/day: 0.30   Types: Cigarettes  Smokeless Tobacco Never   Social History   Substance and Sexual Activity  Alcohol Use Yes   Alcohol/week: 3.0 - 4.0 standard drinks of alcohol   Types: 3 - 4 Standard drinks or equivalent per week    Social History   Substance and Sexual Activity  Drug Use No    Family History  Problem Relation Age of Onset   Diabetes Mother    Hypertension Mother    Cancer Maternal Grandmother    Cancer Paternal Grandmother    Cancer Paternal Grandfather     ALLERGIES:  Oxycodone and Hydrocodone  MEDS:   Current Outpatient Medications on File Prior to Visit  Medication Sig Dispense Refill   albuterol  (VENTOLIN  HFA) 108 (90 Base) MCG/ACT inhaler Inhale 2 puffs into the lungs every 4 (four) hours as needed for wheezing or shortness of breath. 8 g 0   ASPIRIN 81 PO Take by mouth.     B Complex-Folic Acid (B COMPLEX-VITAMIN B12 PO) B Complex-Vitamin B12     budesonide -formoterol  (SYMBICORT ) 80-4.5 MCG/ACT inhaler Inhale 2 puffs into the lungs in the morning and at bedtime. 1 each 12   citalopram  (CELEXA ) 20 MG tablet citalopram  20 mg tablet     fluconazole  (DIFLUCAN ) 150 MG tablet Take 1 tablet (150 mg total) by mouth once a week. 12 tablet 2  glipiZIDE (GLUCOTROL XL) 10 MG 24 hr tablet Take 1 tablet by mouth daily.     ibuprofen  (ADVIL ) 600 MG tablet Take 1 tablet (600 mg total) by mouth every 6 (six) hours as needed. 30 tablet 0   ibuprofen  (ADVIL ) 600 MG tablet Take 1 tablet (600 mg total) by mouth every 6 (six) hours as needed. 90 tablet 1   levonorgestrel  (MIRENA ) 20 MCG/DAY IUD 1 each by Intrauterine route once.     lisinopril (ZESTRIL) 2.5 MG tablet  Take 10 mg by mouth daily.     megestrol  (MEGACE ) 40 MG tablet Take 1 tablet (40 mg total) by mouth 2 (two) times daily. 60 tablet 5   meloxicam  (MOBIC ) 15 MG tablet Take 1 tablet (15 mg total) by mouth daily as needed for pain. 30 tablet 0   metFORMIN  (GLUCOPHAGE ) 500 MG tablet metformin  500 mg tablet     nitrofurantoin , macrocrystal-monohydrate, (MACROBID ) 100 MG capsule Take 1 capsule (100 mg total) by mouth 2 (two) times daily. 10 capsule 0   ondansetron  (ZOFRAN ) 4 MG tablet Take 1 tablet (4 mg total) by mouth every 8 (eight) hours as needed for nausea or vomiting. 20 tablet 0   pantoprazole  (PROTONIX ) 40 MG tablet pantoprazole  40 mg tablet,delayed release     pravastatin (PRAVACHOL) 10 MG tablet Take 1 tablet by mouth at bedtime.     Semaglutide (OZEMPIC, 0.25 OR 0.5 MG/DOSE, [place]) Inject into the skin once a week.     Spacer/Aero-Hold Chamber [employer] Use as directed with albuterol  inhaler 1 Units 0   No current facility-administered medications on file prior to visit.    No orders of the defined types were placed in this encounter.    Physical examination BP 112/79   Pulse 76   Ht 5' 6 (1.676 m)   Wt 282 lb 12.8 oz (128.3 kg)   BMI 45.65 kg/m   General NAD, Conversant  HEENT Atraumatic; Op clear with mmm.  Normo-cephalic.  Anicteric sclerae  Thyroid/Neck Smooth without nodularity or enlargement. Normal ROM.  Neck Supple.  Skin No rashes, lesions or ulceration. Normal palpated skin turgor. No nodularity.  Breasts: No masses or discharge.  Symmetric.  No axillary adenopathy.  Lungs: Clear to auscultation.No rales or wheezes. Normal Respiratory effort, no retractions.  Heart: NSR.  No murmurs or rubs appreciated. No peripheral edema  Abdomen: Soft.  Non-tender.  No masses.  No HSM. No hernia  Extremities: Moves all appropriately.  Normal ROM for age. No lymphadenopathy.  Neuro: Oriented to PPT.  Normal mood. Normal affect.     Pelvic: Deferred to OR    Ultrasound shows IUD  intrauterine  Assessment:   G0P0000 Patient Active Problem List   Diagnosis Date Noted   Osteoarthritis of left knee 03/09/2023   Noninfectious diarrhea    Diabetes mellitus type 2, controlled, without complications (HCC) 12/03/2015   Vitamin D  deficiency 12/03/2015   Vitamin B12 deficiency 12/03/2015   Obesity, morbid, BMI 50 or higher (HCC) 12/02/2015   Depression 12/02/2015   FH: diabetes mellitus 12/02/2015   Bilateral low back pain without sciatica 12/02/2015   Acute pain of left knee 12/02/2015   Fecal incontinence 12/02/2015    1. Pre-op exam   2. Menorrhagia with irregular cycle   3. Intrauterine contraceptive device threads lost, initial encounter      Plan:   Orders: No orders of the defined types were placed in this encounter.    1.  IUD removal  Dr. Starla attempted IUD  removal in the office.  Patient had discomfort and Dr. Starla was unable to locate the IUD.  Ultrasound shows IUD currently in the uterus.  Patient would like OR IUD removal.   Patient was having irregular cycles and a negative endometrial biopsy prior to IUD placement.  She would like to be tested for menopause after her surgery.  Will plan a 10-day hold on hormones and attempt to draw an accurate FSH.  Would otherwise consider Micronor for birth control and cycle control if FSH in normal limits.

## 2024-04-13 NOTE — Progress Notes (Signed)
 Patient presents today for a pre-op exam prior to IUD Removal in the OR. She states no additional concerns today.

## 2024-04-14 ENCOUNTER — Encounter
Admission: RE | Admit: 2024-04-14 | Discharge: 2024-04-14 | Disposition: A | Discharge status: Home / Self Care | Source: Ambulatory Visit | Attending: Obstetrics and Gynecology | Admitting: Obstetrics and Gynecology

## 2024-04-14 ENCOUNTER — Other Ambulatory Visit: Payer: Self-pay

## 2024-04-14 DIAGNOSIS — E119 Type 2 diabetes mellitus without complications: Secondary | ICD-10-CM

## 2024-04-14 DIAGNOSIS — Z01812 Encounter for preprocedural laboratory examination: Secondary | ICD-10-CM

## 2024-04-14 HISTORY — DX: Anemia, unspecified: D64.9

## 2024-04-14 HISTORY — DX: Pneumonia, unspecified organism: J18.9

## 2024-04-14 HISTORY — DX: Unspecified asthma, uncomplicated: J45.909

## 2024-04-14 HISTORY — DX: Excessive and frequent menstruation with irregular cycle: N92.1

## 2024-04-14 HISTORY — DX: Essential (primary) hypertension: I10

## 2024-04-14 HISTORY — DX: Displacement of intrauterine contraceptive device, initial encounter: T83.32XA

## 2024-04-14 HISTORY — DX: Headache, unspecified: R51.9

## 2024-04-14 NOTE — Patient Instructions (Addendum)
 Your procedure is scheduled on: 04/17/24  Report to the Registration Desk on the 1st floor of the [facility]. To find out your arrival time, please call [phone] between 1PM - 3PM on: 04/14/24 If your arrival time is 6:00 am, do not arrive before that time as the [facility] entrance doors do not open until 6:00 am.  REMEMBER: Instructions that are not followed completely may result in serious medical risk, up to and including death; or upon the discretion of your surgeon and anesthesiologist your surgery may need to be rescheduled.  Do not eat food or drink any liquids after midnight the night before surgery.  No gum chewing or hard candies.  One week prior to surgery: Stop Anti-inflammatories (NSAIDS) such as Advil , Aleve, Ibuprofen , Motrin , Naproxen, Naprosyn and Aspirin based products such as Excedrin, Goody's Powder, BC Powder.  You may take Tylenol  if needed for pain up until the day of surgery.  Stop ANY OVER THE COUNTER supplements until after surgery : Calcium-Magnesium-Zinc ,VITAMIN D  .   HOLD OZEMPIC 7 days prior to your surgery.  HOLD Metformin  2 days prior to your surgery.  ON THE DAY OF SURGERY ONLY TAKE THESE MEDICATIONS WITH SIPS OF WATER:  omeprazole (PRILOSEC OTC)     No Alcohol for 24 hours before or after surgery.  No Smoking including e-cigarettes for 24 hours before surgery.  No chewable tobacco products for at least 6 hours before surgery.  No nicotine patches on the day of surgery.  Do not use any recreational drugs for at least a week (preferably 2 weeks) before your surgery.  Please be advised that the combination of cocaine and anesthesia may have negative outcomes, up to and including death. If you test positive for cocaine, your surgery will be cancelled.  On the morning of surgery brush your teeth with toothpaste and water, you may rinse your mouth with mouthwash if you wish. Do not swallow any toothpaste or mouthwash.   Do not wear  jewelry, make-up, hairpins, clips or nail polish.  For welded (permanent) jewelry: bracelets, anklets, waist bands, etc.  Please have this removed prior to surgery.  If it is not removed, there is a chance that hospital personnel will need to cut it off on the day of surgery.  Do not wear lotions, powders, or perfumes.   Do not shave body hair from the neck down 48 hours before surgery.  Contact lenses, hearing aids and dentures may not be worn into surgery.  Do not bring valuables to the hospital. [facility] is not responsible for any missing/lost belongings or valuables.   Notify your doctor if there is any change in your medical condition (cold, fever, infection).  Wear comfortable clothing (specific to your surgery type) to the hospital.  After surgery, you can help prevent lung complications by doing breathing exercises.  Take deep breaths and cough every 1-2 hours. Your doctor may order a device called an Incentive Spirometer to help you take deep breaths.  When coughing or sneezing, hold a pillow firmly against your incision with both hands. This is called "splinting." Doing this helps protect your incision. It also decreases belly discomfort.  If you are being admitted to the hospital overnight, leave your suitcase in the car. After surgery it may be brought to your room.  In case of increased patient census, it may be necessary for you, the patient, to continue your postoperative care in the Same Day Surgery department.  If you are being discharged  the day of surgery, you will not be allowed to drive home. You will need a responsible individual to drive you home and stay with you for 24 hours after surgery.   If you are taking public transportation, you will need to have a responsible individual with you.  Please call the Pre-admissions Testing Dept. at [phone] if you have any questions about these instructions.  Surgery Visitation Policy:  Patients having surgery  or a procedure may have two visitors.  Children under the age of 48 must have an adult with them who is not the patient.  Inpatient Visitation:    Visiting hours are 7 a.m. to 8 p.m. Up to four visitors are allowed at one time in a patient room. The visitors may rotate out with other people during the day.  One visitor age 12 or older may stay with the patient overnight and must be in the room by 8 p.m.   Merchandiser, retail to address health-related social needs:  [email or web address]

## 2024-04-14 NOTE — Progress Notes (Signed)
  Perioperative Services Pre-Admission/Anesthesia Testing    Date: 04/14/24  Name: Robin Hammond DOB: [birth year] MRN:   [id number]  Re: Plans for surgery; GLP-1 recently taken  Planned Surgical Procedure(s):     Case: [id number] Date/Time: 04/17/24 0816   Procedure: REMOVAL, INTRAUTERINE DEVICE   Anesthesia type: Choice   Diagnosis: Intrauterine contraceptive device threads lost, initial encounter [T83.32XA]   Pre-op diagnosis: Lost IUD   Location: [facility] OR ROOM 07 / [facility]   Surgeons: Janit Alm Agent, MD        Clinical Notes:  Patient is scheduled for the above procedure on 04/17/2024 with Dr. Alm Janit, MD.  Preparation for procedure, patient participated in PAT interview today (04/14/2024).  During the course of her assessment, it was learned that the patient is on injectable GLP-1 medication (semaglutide) and her last dose was taken yesterday (04/13/2024).  In order to promote safety and reduce risk of associated aspiration due to delayed gastric emptying, per anesthesia guidelines, these medications are generally held 7 days prior to  patients receiving anesthetic services.  Given the nature of the patient's case (short duration), case was discussed with attending anesthesiologist on-call Alm, MD).  MD was made aware of patient's upcoming case.  We discussed that patient had already increased risk due to BMI of 45.65 kg/m. Given that patient took her GLP-1 yesterday, this places patient at increased risk for aspiration during sedation.  Information was relayed to attending surgeon. MD advising that procedure was short in duration and generally done in the office; second opinion was requested from anesthesia.  I discussed surgeon's concerns with Dr. Mae, who in turn discussed upcoming case details with Dr. Mazzoni.  Both attending anesthesiologist agree that patient is at increased risk for aspiration due to not only to her semaglutide but also her  elevated BMI. Case will need to be postponed pending an appropriate 7-day total duration for her GLP-1 medication. Again, this information was shared with attending surgeon.  Dr. Janit had also spoken with anesthesia at this point. MD with plans to reschedule case per anesthesia recommendations.   No further needs from the PAT department identified at this time.  Dorise Pereyra, MSN, APRN, FNP-C, CEN [facility]  Perioperative Services Nurse Practitioner Phone: [phone] Fax: [phone] 04/14/24 12:52 PM  NOTE: This note has been prepared using Dragon dictation software. Despite my best ability to proofread, there is always the potential that unintentional transcriptional errors may still occur from this process.

## 2024-04-16 MED ORDER — SODIUM CHLORIDE 0.9 % IV SOLN: INTRAVENOUS | Status: DC

## 2024-04-16 MED ORDER — ORAL CARE MOUTH RINSE: 15.0000 mL | Freq: Once | OROMUCOSAL | Status: AC

## 2024-04-16 MED ORDER — CHLORHEXIDINE GLUCONATE 0.12 % MT SOLN
15.0000 mL | Freq: Once | OROMUCOSAL | Status: AC
  Administered 2024-04-17: 15 mL via OROMUCOSAL

## 2024-04-16 MED ORDER — POVIDONE-IODINE 10 % EX SWAB: 2.0000 | Freq: Once | CUTANEOUS | Status: DC

## 2024-04-16 MED ORDER — LACTATED RINGERS IV SOLN: INTRAVENOUS | Status: DC

## 2024-04-17 ENCOUNTER — Encounter: Payer: Self-pay | Admitting: Obstetrics and Gynecology

## 2024-04-17 ENCOUNTER — Ambulatory Visit: Payer: Self-pay | Admitting: Anesthesiology

## 2024-04-17 ENCOUNTER — Encounter
Admission: RE | Disposition: A | Discharge status: Home / Self Care | Payer: Self-pay | Source: Home / Self Care | Attending: Obstetrics and Gynecology

## 2024-04-17 ENCOUNTER — Ambulatory Visit
Admission: RE | Admit: 2024-04-17 | Discharge: 2024-04-17 | Disposition: A | Discharge status: Home / Self Care | Attending: Obstetrics and Gynecology | Admitting: Obstetrics and Gynecology

## 2024-04-17 ENCOUNTER — Ambulatory Visit: Payer: Self-pay | Admitting: Urgent Care

## 2024-04-17 ENCOUNTER — Other Ambulatory Visit: Payer: Self-pay

## 2024-04-17 DIAGNOSIS — X58XXXA Exposure to other specified factors, initial encounter: Secondary | ICD-10-CM | POA: Diagnosis not present

## 2024-04-17 DIAGNOSIS — Y9389 Activity, other specified: Secondary | ICD-10-CM | POA: Insufficient documentation

## 2024-04-17 DIAGNOSIS — F419 Anxiety disorder, unspecified: Secondary | ICD-10-CM | POA: Insufficient documentation

## 2024-04-17 DIAGNOSIS — Z79899 Other long term (current) drug therapy: Secondary | ICD-10-CM | POA: Insufficient documentation

## 2024-04-17 DIAGNOSIS — Z01818 Encounter for other preprocedural examination: Secondary | ICD-10-CM

## 2024-04-17 DIAGNOSIS — Z7984 Long term (current) use of oral hypoglycemic drugs: Secondary | ICD-10-CM | POA: Insufficient documentation

## 2024-04-17 DIAGNOSIS — E119 Type 2 diabetes mellitus without complications: Secondary | ICD-10-CM | POA: Insufficient documentation

## 2024-04-17 DIAGNOSIS — Z7985 Long-term (current) use of injectable non-insulin antidiabetic drugs: Secondary | ICD-10-CM | POA: Diagnosis not present

## 2024-04-17 DIAGNOSIS — E66813 Obesity, class 3: Secondary | ICD-10-CM | POA: Diagnosis not present

## 2024-04-17 DIAGNOSIS — T8332XA Displacement of intrauterine contraceptive device, initial encounter: Secondary | ICD-10-CM

## 2024-04-17 DIAGNOSIS — Z833 Family history of diabetes mellitus: Secondary | ICD-10-CM | POA: Diagnosis not present

## 2024-04-17 DIAGNOSIS — K219 Gastro-esophageal reflux disease without esophagitis: Secondary | ICD-10-CM | POA: Diagnosis not present

## 2024-04-17 DIAGNOSIS — J45909 Unspecified asthma, uncomplicated: Secondary | ICD-10-CM | POA: Diagnosis not present

## 2024-04-17 DIAGNOSIS — Z6841 Body Mass Index (BMI) 40.0 and over, adult: Secondary | ICD-10-CM | POA: Diagnosis not present

## 2024-04-17 DIAGNOSIS — F32A Depression, unspecified: Secondary | ICD-10-CM | POA: Insufficient documentation

## 2024-04-17 DIAGNOSIS — Z01810 Encounter for preprocedural cardiovascular examination: Secondary | ICD-10-CM | POA: Diagnosis not present

## 2024-04-17 DIAGNOSIS — G4730 Sleep apnea, unspecified: Secondary | ICD-10-CM | POA: Insufficient documentation

## 2024-04-17 DIAGNOSIS — Z7951 Long term (current) use of inhaled steroids: Secondary | ICD-10-CM | POA: Insufficient documentation

## 2024-04-17 DIAGNOSIS — M1990 Unspecified osteoarthritis, unspecified site: Secondary | ICD-10-CM | POA: Insufficient documentation

## 2024-04-17 DIAGNOSIS — Z793 Long term (current) use of hormonal contraceptives: Secondary | ICD-10-CM | POA: Diagnosis not present

## 2024-04-17 DIAGNOSIS — I10 Essential (primary) hypertension: Secondary | ICD-10-CM | POA: Diagnosis not present

## 2024-04-17 DIAGNOSIS — F17210 Nicotine dependence, cigarettes, uncomplicated: Secondary | ICD-10-CM | POA: Diagnosis not present

## 2024-04-17 DIAGNOSIS — Z01812 Encounter for preprocedural laboratory examination: Secondary | ICD-10-CM

## 2024-04-17 HISTORY — DX: Other complications of anesthesia, initial encounter: T88.59XA

## 2024-04-17 HISTORY — PX: IUD REMOVAL: SHX5392

## 2024-04-17 HISTORY — PX: DILATION AND CURETTAGE OF UTERUS: SHX78

## 2024-04-17 LAB — CBC
HCT: 37.5 % (ref 36.0–46.0)
Hemoglobin: 12.6 g/dL (ref 12.0–15.0)
MCH: 29.8 pg (ref 26.0–34.0)
MCHC: 33.6 g/dL (ref 30.0–36.0)
MCV: 88.7 fL (ref 80.0–100.0)
Platelets: 368 K/uL (ref 150–400)
RBC: 4.23 MIL/uL (ref 3.87–5.11)
RDW: 13.1 % (ref 11.5–15.5)
WBC: 12.4 K/uL — ABNORMAL HIGH (ref 4.0–10.5)
nRBC: 0 % (ref 0.0–0.2)

## 2024-04-17 LAB — GLUCOSE, CAPILLARY
Glucose-Capillary: 145 mg/dL — ABNORMAL HIGH (ref 70–99)
Glucose-Capillary: 126 mg/dL — ABNORMAL HIGH (ref 70–99)

## 2024-04-17 LAB — TYPE AND SCREEN
ABO/RH(D): A POS
Antibody Screen: NEGATIVE

## 2024-04-17 LAB — POCT PREGNANCY, URINE: Preg Test, Ur: NEGATIVE

## 2024-04-17 LAB — ABO/RH: ABO/RH(D): A POS

## 2024-04-17 SURGERY — REMOVAL, INTRAUTERINE DEVICE
Anesthesia: General | Site: Uterus

## 2024-04-17 MED ORDER — ONDANSETRON HCL 4 MG/2ML IJ SOLN
INTRAMUSCULAR | Status: DC | PRN
  Administered 2024-04-17: 4 mg via INTRAVENOUS

## 2024-04-17 MED ORDER — CHLORHEXIDINE GLUCONATE 0.12 % MT SOLN
OROMUCOSAL | Status: AC
  Filled 2024-04-17: qty 15

## 2024-04-17 MED ORDER — KETOROLAC TROMETHAMINE 30 MG/ML IJ SOLN
INTRAMUSCULAR | Status: DC | PRN
  Administered 2024-04-17: 15 mg via INTRAVENOUS

## 2024-04-17 MED ORDER — DEXAMETHASONE SODIUM PHOSPHATE 10 MG/ML IJ SOLN
INTRAMUSCULAR | Status: AC
  Filled 2024-04-17: qty 1

## 2024-04-17 MED ORDER — ACETAMINOPHEN 10 MG/ML IV SOLN: 1000.0000 mg | Freq: Once | INTRAVENOUS | Status: DC | PRN

## 2024-04-17 MED ORDER — LIDOCAINE HCL (PF) 2 % IJ SOLN
INTRAMUSCULAR | Status: AC
  Filled 2024-04-17: qty 5

## 2024-04-17 MED ORDER — ONDANSETRON HCL 4 MG/2ML IJ SOLN
INTRAMUSCULAR | Status: AC
  Filled 2024-04-17: qty 2

## 2024-04-17 MED ORDER — PROPOFOL 10 MG/ML IV BOLUS
INTRAVENOUS | Status: DC | PRN
  Administered 2024-04-17: 50 mg via INTRAVENOUS
  Administered 2024-04-17: 100 mg via INTRAVENOUS
  Administered 2024-04-17 (×2): 30 mg via INTRAVENOUS
  Administered 2024-04-17: 200 mg via INTRAVENOUS
  Administered 2024-04-17: 40 mg via INTRAVENOUS

## 2024-04-17 MED ORDER — PROPOFOL 10 MG/ML IV BOLUS
INTRAVENOUS | Status: AC
  Filled 2024-04-17: qty 40

## 2024-04-17 MED ORDER — SUCCINYLCHOLINE CHLORIDE 200 MG/10ML IV SOSY
PREFILLED_SYRINGE | INTRAVENOUS | Status: AC
  Filled 2024-04-17: qty 10

## 2024-04-17 MED ORDER — SODIUM CHLORIDE 0.9 % IR SOLN
Status: DC | PRN
  Administered 2024-04-17: 2000 mL

## 2024-04-17 MED ORDER — 0.9 % SODIUM CHLORIDE (POUR BTL) OPTIME
TOPICAL | Status: DC | PRN
  Administered 2024-04-17: 500 mL

## 2024-04-17 MED ORDER — DEXAMETHASONE SODIUM PHOSPHATE 10 MG/ML IJ SOLN
INTRAMUSCULAR | Status: DC | PRN
  Administered 2024-04-17: 5 mg via INTRAVENOUS

## 2024-04-17 MED ORDER — MIDAZOLAM HCL 2 MG/2ML IJ SOLN
INTRAMUSCULAR | Status: AC
  Filled 2024-04-17: qty 2

## 2024-04-17 MED ORDER — HYDROCODONE-ACETAMINOPHEN 5-325 MG PO TABS: 1.0000 | ORAL_TABLET | Freq: Four times a day (QID) | ORAL | 0 refills | Status: AC | PRN

## 2024-04-17 MED ORDER — DROPERIDOL 2.5 MG/ML IJ SOLN: 0.6250 mg | Freq: Once | INTRAMUSCULAR | Status: DC | PRN

## 2024-04-17 MED ORDER — LIDOCAINE HCL (CARDIAC) PF 100 MG/5ML IV SOSY
PREFILLED_SYRINGE | INTRAVENOUS | Status: DC | PRN
  Administered 2024-04-17: 100 mg via INTRAVENOUS

## 2024-04-17 MED ORDER — KETOROLAC TROMETHAMINE 30 MG/ML IJ SOLN
INTRAMUSCULAR | Status: AC
  Filled 2024-04-17: qty 1

## 2024-04-17 MED ORDER — LACTATED RINGERS IV SOLN: INTRAVENOUS | Status: DC | PRN

## 2024-04-17 MED ORDER — SUCCINYLCHOLINE CHLORIDE 200 MG/10ML IV SOSY
PREFILLED_SYRINGE | INTRAVENOUS | Status: DC | PRN
  Administered 2024-04-17: 160 mg via INTRAVENOUS

## 2024-04-17 SURGICAL SUPPLY — 19 items
CUP MEDICINE 2OZ PLAST GRAD ST (MISCELLANEOUS) ×3 IMPLANT
DRAPE UNDER BUTTOCK W/FLU (DRAPES) ×3 IMPLANT
DRSG TELFA 3X8 NADH STRL (GAUZE/BANDAGES/DRESSINGS) ×3 IMPLANT
GLOVE INDICATOR 7.0 STRL GRN (GLOVE) ×3 IMPLANT
GLOVE PI ORTHO PRO STRL 7.5 (GLOVE) ×3 IMPLANT
GOWN STRL REUS W/ TWL LRG LVL3 (GOWN DISPOSABLE) ×3 IMPLANT
IV NS 1000ML BAXH (IV SOLUTION) IMPLANT
KIT TURNOVER CYSTO (KITS) ×3 IMPLANT
MANIFOLD NEPTUNE II (INSTRUMENTS) ×3 IMPLANT
NS IRRIG 500ML POUR BTL (IV SOLUTION) IMPLANT
PACK DNC HYST (MISCELLANEOUS) ×3 IMPLANT
PAD OB MATERNITY 11 LF (PERSONAL CARE ITEMS) ×3 IMPLANT
PAD PREP OB/GYN DISP 24X41 (PERSONAL CARE ITEMS) ×3 IMPLANT
SCRUB CHG 4% DYNA-HEX 4OZ (MISCELLANEOUS) ×3 IMPLANT
SET CYSTO W/LG BORE CLAMP LF (SET/KITS/TRAYS/PACK) IMPLANT
SOLUTION PREP PVP 2OZ (MISCELLANEOUS) ×3 IMPLANT
TOWEL OR 17X26 4PK STRL BLUE (TOWEL DISPOSABLE) ×3 IMPLANT
TRAP FLUID SMOKE EVACUATOR (MISCELLANEOUS) ×3 IMPLANT
WATER STERILE IRR 500ML POUR (IV SOLUTION) ×3 IMPLANT

## 2024-04-17 NOTE — Op Note (Signed)
   OPERATIVE NOTE 04/17/2024 9:58 AM  PRE-OPERATIVE DIAGNOSIS:  1) Lost IUD  POST-OPERATIVE DIAGNOSIS:  Post-Op Diagnosis Codes:    * Intrauterine contraceptive device threads lost, initial encounter [T83.32XA]  OPERATION:  Hysteroscopy D&C  SURGEON(S): Surgeons and Role:    DEWAINE Janit Alm Lynwood, MD - Primary   ANESTHESIA: Choice  ESTIMATED BLOOD LOSS: 10mL  OPERATIVE FINDINGS: No evidence of intrauterine IUD  SPECIMEN:  ID Type Source Tests Collected by Time Destination  1 : Endometrial Curettings Tissue Path Tissue SURGICAL PATHOLOGY Janit Alm Lynwood, MD 04/17/2024 [phone]     COMPLICATIONS: None  DRAINS: None  DISPOSITION: Stable to recovery room  DESCRIPTION OF PROCEDURE:      The patient was prepped and draped in the dorsal lithotomy position and placed under general anesthesia. Her bladder was emptied. The cervix was grasped with a ring forceps. Respecting the position and curvature of her cervix, it was dilated to accommodate a polyp forceps.  The uterine cavity was systematically explored and no IUD could be located.  We rapidly set up for and initiated a hysteroscopy.  The hysteroscope was placed in the usual manner.  No IUD in the endometrial cavity could be identified.  The hysteroscope was removed and a curette was placed in the endometrial cavity and a systematic curettage was performed in all quadrants until no additional tissue was noted.  Again the IUD was not located.  The hysteroscope was again replaced and the endometrial cavity explored.  No evidence of an IUD was noted.  The ring forceps was removed from the cervix and hemostasis was noted. The weighted speculum was removed and the patient went to recovery room in stable condition.  No follow-up provider specified.  Alm DOROTHA Janit, M.D. 04/17/2024 9:58 AM

## 2024-04-17 NOTE — Anesthesia Procedure Notes (Signed)
 Procedure Name: Intubation Date/Time: 04/17/2024 9:19 AM  Performed by: Dyane Mass, CRNAPre-anesthesia Checklist: Patient identified, Emergency Drugs available, Suction available and Patient being monitored Patient Re-evaluated:Patient Re-evaluated prior to induction Oxygen Delivery Method: Circle system utilized Preoxygenation: Pre-oxygenation with 100% oxygen Induction Type: IV induction Ventilation: Mask ventilation without difficulty Laryngoscope Size: McGrath and 3 Grade View: Grade I Tube type: Oral Tube size: 7.0 mm Number of attempts: 1 Airway Equipment and Method: Stylet and Oral airway Placement Confirmation: ETT inserted through vocal cords under direct vision, positive ETCO2 and breath sounds checked- equal and bilateral Secured at: 22 cm Tube secured with: Tape Dental Injury: Teeth and Oropharynx as per pre-operative assessment

## 2024-04-17 NOTE — Anesthesia Preprocedure Evaluation (Addendum)
 Anesthesia Evaluation  Patient identified by MRN, date of birth, ID band Patient awake    Reviewed: Allergy & Precautions, H&P , NPO status , Patient's Chart, lab work & pertinent test results  Airway Mallampati: III  TM Distance: >3 FB Neck ROM: full    Dental no notable dental hx.    Pulmonary asthma , sleep apnea , former smoker   Pulmonary exam normal        Cardiovascular hypertension, Normal cardiovascular exam     Neuro/Psych  PSYCHIATRIC DISORDERS Anxiety Depression    negative neurological ROS     GI/Hepatic Neg liver ROS,GERD  ,,  Endo/Other  diabetes  Class 3 obesity  Renal/GU      Musculoskeletal  (+) Arthritis ,    Abdominal  (+) + obese  Peds  Hematology   Anesthesia Other Findings Past Medical History: No date: Anemia No date: Anxiety No date: Asthma No date: Depression No date: Diabetes mellitus without complication (HCC) No date: GERD (gastroesophageal reflux disease) No date: Headache No date: Hemorrhoids No date: Hypertension No date: Intrauterine contraceptive device threads lost, initial  encounter No date: Low serum vitamin D  No date: Menorrhagia with irregular cycle No date: Obesity No date: Pneumonia No date: Sleep apnea  Past Surgical History: No date: BREAST REDUCTION SURGERY 01/14/2016: COLONOSCOPY WITH PROPOFOL ; N/A     Comment:  Procedure: COLONOSCOPY WITH PROPOFOL ;  Surgeon: Rogelia Copping, MD;  Location: [facility];  Service: Endoscopy;              Laterality: N/A; No date: TONSILLECTOMY     Reproductive/Obstetrics negative OB ROS                              Anesthesia Physical Anesthesia Plan  ASA: 3  Anesthesia Plan: General ETT   Post-op Pain Management: Minimal or no pain anticipated   Induction: Intravenous and Rapid sequence  PONV Risk Score and Plan: 2 and Ondansetron , Dexamethasone  and Midazolam   Airway  Management Planned: Oral ETT  Additional Equipment:   Intra-op Plan:   Post-operative Plan: Extubation in OR  Informed Consent: I have reviewed the patients History and Physical, chart, labs and discussed the procedure including the risks, benefits and alternatives for the proposed anesthesia with the patient or authorized representative who has indicated his/her understanding and acceptance.     Dental Advisory Given  Plan Discussed with: CRNA and Surgeon  Anesthesia Plan Comments: (GLP-1 use on Thursday. Pt denies GERD today. We discussed the risk of aspiration and the scenarios to decrease risk involving rescheduling to allow more time from GLP-1 use and a 24 hour CLD. She understands but wishes to proceed. Plan for GETA. )         Anesthesia Quick Evaluation

## 2024-04-17 NOTE — Interval H&P Note (Signed)
 History and Physical Interval Note:  04/17/2024 7:39 AM  Robin Hammond  has presented today for surgery, with the diagnosis of Lost IUD.  The various methods of treatment have been discussed with the patient and family. After consideration of risks, benefits and other options for treatment, the patient has consented to  Procedure(s): REMOVAL, INTRAUTERINE DEVICE (N/A) as a surgical intervention.  The patient's history has been reviewed, patient examined, no change in status, stable for surgery.  I have reviewed the patient's chart and labs.  Questions were answered to the patient's satisfaction.     Alm Sar

## 2024-04-17 NOTE — Transfer of Care (Signed)
 Immediate Anesthesia Transfer of Care Note  Patient: Robin Hammond  Procedure(s) Performed: REMOVAL, INTRAUTERINE DEVICE HYSTEROSCOPY, DIAGNOSTIC (Uterus) DILATION AND CURETTAGE (Uterus)  Patient Location: PACU  Anesthesia Type:General  Level of Consciousness: drowsy and patient cooperative  Airway & Oxygen Therapy: Patient Spontanous Breathing and Patient connected to face mask oxygen  Post-op Assessment: Report given to RN and Post -op Vital signs reviewed and stable  Post vital signs: Reviewed and stable  Last Vitals:  Vitals Value Taken Time  BP 115/82 04/17/24 10:11  Temp 36.1 C 04/17/24 10:11  Pulse 89 04/17/24 10:15  Resp 36 04/17/24 10:15  SpO2 100 % 04/17/24 10:15  Vitals shown include unfiled device data.  Last Pain:  Vitals:   04/17/24 0657  TempSrc: Temporal  PainSc: 0-No pain         Complications: No notable events documented.

## 2024-04-17 NOTE — Anesthesia Postprocedure Evaluation (Signed)
 Anesthesia Post Note  Patient: Robin Hammond  Procedure(s) Performed: REMOVAL, INTRAUTERINE DEVICE HYSTEROSCOPY, DIAGNOSTIC (Uterus) DILATION AND CURETTAGE (Uterus)  Patient location during evaluation: PACU Anesthesia Type: General Level of consciousness: awake and alert Pain management: pain level controlled Vital Signs Assessment: post-procedure vital signs reviewed and stable Respiratory status: spontaneous breathing, nonlabored ventilation and respiratory function stable Cardiovascular status: blood pressure returned to baseline and stable Postop Assessment: no apparent nausea or vomiting Anesthetic complications: no   No notable events documented.   Last Vitals:  Vitals:   04/17/24 1130 04/17/24 1226  BP: 137/69 (!) 141/50  Pulse: 70 73  Resp: 18 18  Temp: 36.4 C 36.4 C  SpO2: 100% 99%    Last Pain:  Vitals:   04/17/24 1226  TempSrc: Tympanic  PainSc:                  Camellia Merilee Louder

## 2024-04-18 ENCOUNTER — Encounter: Payer: Self-pay | Admitting: Obstetrics and Gynecology

## 2024-04-18 ENCOUNTER — Other Ambulatory Visit: Payer: Self-pay

## 2024-04-18 DIAGNOSIS — T8332XD Displacement of intrauterine contraceptive device, subsequent encounter: Secondary | ICD-10-CM

## 2024-04-18 LAB — SURGICAL PATHOLOGY

## 2024-04-18 NOTE — Progress Notes (Signed)
 X-ray's ordered Per Dr.Evans after failed OR removal

## 2024-04-19 ENCOUNTER — Telehealth: Payer: Self-pay

## 2024-04-19 NOTE — Telephone Encounter (Signed)
 Pt returned phone call, she wants to know if she should continue to take megace  40 mg? She started taking it again yesterday 7/23 because she started bleeding again. Advised DJE out of office today, returns tomorrow. Forwarding msg.

## 2024-04-19 NOTE — Telephone Encounter (Signed)
 Received after hours nurse fax, pt called 04/18/24 @ 3:24 pm with qs regarding surgery and medications. Called her to get info, no answer, LVMTRC.

## 2024-04-20 NOTE — Telephone Encounter (Signed)
 Spoke to patient. Advised bleeding after procedure is normal and Megace  is not needed at this time. Advised on X-rays and that someone will reach out to her to schedule.

## 2024-04-25 ENCOUNTER — Ambulatory Visit
Admission: RE | Admit: 2024-04-25 | Discharge: 2024-04-25 | Disposition: A | Discharge status: Home / Self Care | Source: Ambulatory Visit | Attending: Obstetrics and Gynecology | Admitting: Obstetrics and Gynecology

## 2024-04-25 ENCOUNTER — Ambulatory Visit
Admission: RE | Admit: 2024-04-25 | Discharge: 2024-04-25 | Disposition: A | Discharge status: Home / Self Care | Attending: Obstetrics and Gynecology | Admitting: Obstetrics and Gynecology

## 2024-04-25 DIAGNOSIS — T8332XD Displacement of intrauterine contraceptive device, subsequent encounter: Secondary | ICD-10-CM

## 2024-04-27 ENCOUNTER — Ambulatory Visit (INDEPENDENT_AMBULATORY_CARE_PROVIDER_SITE_OTHER): Admitting: Obstetrics and Gynecology

## 2024-04-27 ENCOUNTER — Encounter: Payer: Self-pay | Admitting: Obstetrics and Gynecology

## 2024-04-27 VITALS — BP 121/82 | HR 87 | Ht 64.0 in | Wt 285.5 lb

## 2024-04-27 DIAGNOSIS — Z4889 Encounter for other specified surgical aftercare: Secondary | ICD-10-CM

## 2024-04-27 DIAGNOSIS — Z9889 Other specified postprocedural states: Secondary | ICD-10-CM

## 2024-04-27 DIAGNOSIS — N951 Menopausal and female climacteric states: Secondary | ICD-10-CM

## 2024-04-27 MED ORDER — NORETHINDRONE 0.35 MG PO TABS: 1.0000 | ORAL_TABLET | Freq: Every day | ORAL | 1 refills | Status: AC

## 2024-04-27 NOTE — Progress Notes (Signed)
 Patient presents for 1.5 week postop follow-up following D&C. She states still experiencing light bleeding along with cramping. Imaging completed to locate IUD.

## 2024-04-27 NOTE — Progress Notes (Signed)
 HPI:      [name] is a 48 y.o. G0P0000 who LMP was No LMP recorded. (Menstrual status: IUD).  Subjective:   She presents today a week and a half postop from hysteroscopy D&C and attempted IUD removal.  No IUD was found despite a previous ultrasound showing an IUD in the uterus.  She has since undergone an abdominal x-ray which reveals no IUD in the abdomen or pelvis. She reports that she still has some spotting postop but is otherwise doing well. Prior to her surgery she was on Megace  for a while and not having menstrual periods.  She thought that there was a chance that she could be in menopause and would like to be tested for this.    Hx: The following portions of the patient's history were reviewed and updated as appropriate:             She  has a past medical history of Anemia, Anxiety, Asthma, Complication of anesthesia, Depression, Diabetes mellitus without complication (HCC), GERD (gastroesophageal reflux disease), Headache, Hemorrhoids, Hypertension, Intrauterine contraceptive device threads lost, initial encounter, Low serum vitamin D , Menorrhagia with irregular cycle, Obesity, Pneumonia, and Sleep apnea. She does not have any pertinent problems on file. She  has a past surgical history that includes Breast reduction surgery; Tonsillectomy; Colonoscopy with propofol  (N/A, 01/14/2016); IUD removal (N/A, 04/17/2024); and Dilation and curettage of uterus (04/17/2024). Her family history includes Cancer in her maternal grandmother, paternal grandfather, and paternal grandmother; Diabetes in her mother; Hypertension in her mother. She  reports that she has quit smoking. Her smoking use included cigarettes. She has never used smokeless tobacco. She reports that she does not currently use alcohol. She reports that she does not use drugs. She has a current medication list which includes the following prescription(s): calcium-magnesium-zinc, vitamin d , hydrocodone -acetaminophen , ibuprofen ,  lisinopril, metformin , omeprazole, pravastatin, ozempic (2 mg/dose), spacer/aero-hold chamber bags, and tramadol. She is allergic to oxycodone, hydrocodone , and tramadol.       Review of Systems:  Review of Systems  Constitutional: Denied constitutional symptoms, night sweats, recent illness, fatigue, fever, insomnia and weight loss.  Eyes: Denied eye symptoms, eye pain, photophobia, vision change and visual disturbance.  Ears/Nose/Throat/Neck: Denied ear, nose, throat or neck symptoms, hearing loss, nasal discharge, sinus congestion and sore throat.  Cardiovascular: Denied cardiovascular symptoms, arrhythmia, chest pain/pressure, edema, exercise intolerance, orthopnea and palpitations.  Respiratory: Denied pulmonary symptoms, asthma, pleuritic pain, productive sputum, cough, dyspnea and wheezing.  Gastrointestinal: Denied, gastro-esophageal reflux, melena, nausea and vomiting.  Genitourinary: Denied genitourinary symptoms including symptomatic vaginal discharge, pelvic relaxation issues, and urinary complaints.  Musculoskeletal: Denied musculoskeletal symptoms, stiffness, swelling, muscle weakness and myalgia.  Dermatologic: Denied dermatology symptoms, rash and scar.  Neurologic: Denied neurology symptoms, dizziness, headache, neck pain and syncope.  Psychiatric: Denied psychiatric symptoms, anxiety and depression.  Endocrine: Denied endocrine symptoms including hot flashes and night sweats.   Meds:   Current Outpatient Medications on File Prior to Visit  Medication Sig Dispense Refill   Calcium-Magnesium-Zinc (CAL-MAG-ZINC PO) Take 1 tablet by mouth daily.     Cholecalciferol (VITAMIN D ) 50 MCG (2000 UT) tablet Take 2,000 Units by mouth daily.     HYDROcodone -acetaminophen  (NORCO/VICODIN) 5-325 MG tablet Take 1-2 tablets by mouth every 6 (six) hours as needed for moderate pain (pain score 4-6). 8 tablet 0   ibuprofen  (ADVIL ) 600 MG tablet Take 1 tablet (600 mg total) by mouth every 6  (six) hours as needed. 90 tablet 1  lisinopril (ZESTRIL) 20 MG tablet Take 20 mg by mouth daily.     metFORMIN  (GLUCOPHAGE ) 500 MG tablet Take 1,000 mg by mouth daily.     omeprazole (PRILOSEC OTC) 20 MG tablet Take 20 mg by mouth daily as needed (heartburn).     pravastatin (PRAVACHOL) 10 MG tablet Take 1 tablet by mouth at bedtime.     Semaglutide, 2 MG/DOSE, (OZEMPIC, 2 MG/DOSE,) 8 MG/3ML SOPN Inject 2 mg into the skin every Thursday.     Spacer/Aero-Hold Chamber [employer] Use as directed with albuterol  inhaler 1 Units 0   traMADol (ULTRAM) 50 MG tablet Take 50 mg by mouth daily as needed for moderate pain (pain score 4-6). (Patient not taking: Reported on 04/27/2024)     No current facility-administered medications on file prior to visit.      Objective:     Vitals:   04/27/24 1112  BP: 121/82  Pulse: 87   Filed Weights   04/27/24 1112  Weight: 285 lb 8 oz (129.5 kg)                        Assessment:    G0P0000 Patient Active Problem List   Diagnosis Date Noted   Osteoarthritis of left knee 03/09/2023   Noninfectious diarrhea    Diabetes mellitus type 2, controlled, without complications (HCC) 12/03/2015   Vitamin D  deficiency 12/03/2015   Vitamin B12 deficiency 12/03/2015   Obesity, morbid, BMI 50 or higher (HCC) 12/02/2015   Depression 12/02/2015   FH: diabetes mellitus 12/02/2015   Bilateral low back pain without sciatica 12/02/2015   Acute pain of left knee 12/02/2015   Fecal incontinence 12/02/2015     1. Postoperative state   2. Perimenopausal     No evidence of hyperplasia on D&C.  No intra-abdominal or intrauterine IUD.    Plan:            1.  FSH to rule out menopause  2.  Will begin progesterone daily and attempt to control her menstrual bleeding if we find that she is not in menopause.  Orders Orders Placed This Encounter  Procedures   FSH    No orders of the defined types were placed in this encounter.     F/U  Return in about 3  months (around 07/28/2024).  Alm DOROTHA Sar, M.D. 04/27/2024 11:53 AM

## 2024-04-28 ENCOUNTER — Ambulatory Visit: Payer: Self-pay

## 2024-04-28 LAB — FOLLICLE STIMULATING HORMONE: FSH: 5.9 m[IU]/mL

## 2024-05-09 NOTE — Telephone Encounter (Signed)
 Patient is calling requesting results from [facility] in order to see if she needs to begin taking her progesterone. Please advise.

## 2024-05-15 ENCOUNTER — Ambulatory Visit (INDEPENDENT_AMBULATORY_CARE_PROVIDER_SITE_OTHER)

## 2024-05-15 ENCOUNTER — Ambulatory Visit
Admission: EM | Admit: 2024-05-15 | Discharge: 2024-05-15 | Disposition: A | Discharge status: Home / Self Care | Attending: Family Medicine | Admitting: Family Medicine

## 2024-05-15 ENCOUNTER — Encounter: Payer: Self-pay | Admitting: Emergency Medicine

## 2024-05-15 DIAGNOSIS — M25522 Pain in left elbow: Secondary | ICD-10-CM

## 2024-05-15 DIAGNOSIS — M25532 Pain in left wrist: Secondary | ICD-10-CM

## 2024-05-15 MED ORDER — PREDNISONE 10 MG PO TABS: 20.0000 mg | ORAL_TABLET | Freq: Every day | ORAL | 0 refills | Status: AC

## 2024-05-15 MED ORDER — GABAPENTIN 100 MG PO CAPS: 100.0000 mg | ORAL_CAPSULE | Freq: Three times a day (TID) | ORAL | 0 refills | Status: AC | PRN

## 2024-05-15 MED ORDER — DEXAMETHASONE SODIUM PHOSPHATE 10 MG/ML IJ SOLN
10.0000 mg | Freq: Once | INTRAMUSCULAR | Status: AC
  Administered 2024-05-15: 10 mg via INTRAMUSCULAR

## 2024-05-15 NOTE — Discharge Instructions (Addendum)
 Stop by the pharmacy to pick up your prescriptions.  Follow up with your primary care provider or return to the urgent care, if not improving.   Call your primary care provider if your blood sugar is above 350 or your blood pressure goes higher than 170 / 100.

## 2024-05-15 NOTE — ED Triage Notes (Signed)
 Pt c/o left forearm pain from her elbow to her wrist x 2 days. Pt denies any injury or any other symptoms. She has taken ibuprofen  800 mg with no relief.

## 2024-05-15 NOTE — ED Provider Notes (Signed)
 [facility]    CSN: [id number] Arrival date & time: 05/15/24  1627      History   Chief Complaint Chief Complaint  Patient presents with   Arm Pain    HPI  HPI Robin Hammond is a 48 y.o. female.   Robin Hammond presents for constant throbbing left forearm pain for the past 2 days. 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 No falls, trauma or known injuries.  Has stiffness in the morning.  Reports difficulty twisting her arm back-and-forth.  Has been taking 800 mg ibuprofen  and applying warm compresses without relief.  She types at work.  No known family history of autoimmune arthritis.      Past Medical History:  Diagnosis Date   Anemia    Anxiety    Asthma    Complication of anesthesia    delayed emergence, 2 hr's in pacu, after GETA with volatile and propofol  boluses   Depression    Diabetes mellitus without complication (HCC)    GERD (gastroesophageal reflux disease)    Headache    Hemorrhoids    Hypertension    Intrauterine contraceptive device threads lost, initial encounter    Low serum vitamin D     Menorrhagia with irregular cycle    Obesity    Pneumonia    Sleep apnea     Patient Active Problem List   Diagnosis Date Noted   Osteoarthritis of left knee 03/09/2023   Noninfectious diarrhea    Diabetes mellitus type 2, controlled, without complications (HCC) 12/03/2015   Vitamin D  deficiency 12/03/2015   Vitamin B12 deficiency 12/03/2015   Obesity, morbid, BMI 50 or higher (HCC) 12/02/2015   Depression 12/02/2015   FH: diabetes mellitus 12/02/2015   Bilateral low back pain without sciatica 12/02/2015   Acute pain of left knee 12/02/2015   Fecal incontinence 12/02/2015    Past Surgical History:  Procedure Laterality Date   BREAST REDUCTION SURGERY     COLONOSCOPY WITH PROPOFOL  N/A 01/14/2016   Procedure: COLONOSCOPY WITH PROPOFOL ;  Surgeon: Rogelia Copping, MD;  Location: [facility];  Service: Endoscopy;  Laterality: N/A;   DILATION AND  CURETTAGE OF UTERUS  04/17/2024   Procedure: DILATION AND CURETTAGE;  Surgeon: Janit Alm Agent, MD;  Location: [facility] OR[facility];  Service: Gynecology;;   IUD REMOVAL N/A 04/17/2024   Procedure: REMOVAL, INTRAUTERINE DEVICE;  Surgeon: Janit Alm Agent, MD;  Location: [facility] OR[facility];  Service: Gynecology;  Laterality: N/A;   TONSILLECTOMY      OB History     Gravida  0   Para  0   Term  0   Preterm  0   AB  0   Living  0      SAB  0   IAB  0   Ectopic  0   Multiple  0   Live Births  0            Home Medications    Prior to Admission medications   Medication Sig Start Date End Date Taking? Authorizing Provider  gabapentin  (NEURONTIN ) 100 MG capsule Take 1 capsule (100 mg total) by mouth 3 (three) times daily as needed. 05/15/24  Yes Elodie Panameno, DO  norethindrone  (MICRONOR ) 0.35 MG tablet Take 1 tablet (0.35 mg total) by mouth daily. 04/27/24  Yes Janit Alm Agent, MD  predniSONE  (DELTASONE ) 10 MG tablet Take 2 tablets (20 mg total) by mouth daily. 05/15/24  Yes Robin Vinsant, DO  Calcium-Magnesium-Zinc (CAL-MAG-ZINC PO) Take 1 tablet by mouth  daily.    [provider]  Cholecalciferol (VITAMIN D ) 50 MCG (2000 UT) tablet Take 2,000 Units by mouth daily.    [provider]  HYDROcodone -acetaminophen  (NORCO/VICODIN) 5-325 MG tablet Take 1-2 tablets by mouth every 6 (six) hours as needed for moderate pain (pain score 4-6). 04/17/24   Janit Alm Agent, MD  ibuprofen  (ADVIL ) 600 MG tablet Take 1 tablet (600 mg total) by mouth every 6 (six) hours as needed. 02/09/24   Dove, Myra C, MD  lisinopril (ZESTRIL) 20 MG tablet Take 20 mg by mouth daily.    [provider]  metFORMIN  (GLUCOPHAGE ) 500 MG tablet Take 1,000 mg by mouth daily.    [provider]  omeprazole (PRILOSEC OTC) 20 MG tablet Take 20 mg by mouth daily as needed (heartburn).    [provider]  pravastatin (PRAVACHOL) 10 MG tablet Take 1 tablet by mouth at bedtime.  06/08/22   [provider]  Semaglutide, 2 MG/DOSE, (OZEMPIC, 2 MG/DOSE,) 8 MG/3ML SOPN Inject 2 mg into the skin every Thursday.    [provider]  Spacer/Aero-Hold Chamber Bags MISC Use as directed with albuterol  inhaler 09/17/22   Jacolyn Pae, MD  traMADol (ULTRAM) 50 MG tablet Take 50 mg by mouth daily as needed for moderate pain (pain score 4-6). Patient not taking: Reported on 04/27/2024    [provider]    Family History Family History  Problem Relation Age of Onset   Diabetes Mother    Hypertension Mother    Cancer Maternal Grandmother    Cancer Paternal Grandmother    Cancer Paternal Grandfather     Social History Social History   Tobacco Use   Smoking status: Former    Current packs/day: 0.30    Types: Cigarettes   Smokeless tobacco: Never  Vaping Use   Vaping status: Former  Substance Use Topics   Alcohol use: Not Currently    Comment: rarely   Drug use: No     Allergies   Oxycodone, Hydrocodone , and Tramadol   Review of Systems Review of Systems: :negative unless otherwise stated in HPI.      Physical Exam Triage Vital Signs ED Triage Vitals  Encounter Vitals Group     BP 05/15/24 1741 (!) 143/90     Girls Systolic BP Percentile --      Girls Diastolic BP Percentile --      Boys Systolic BP Percentile --      Boys Diastolic BP Percentile --      Pulse Rate 05/15/24 1741 84     Resp 05/15/24 1741 16     Temp 05/15/24 1741 99.1 F (37.3 C)     Temp Source 05/15/24 1741 Oral     SpO2 05/15/24 1741 95 %     Weight 05/15/24 1735 284 lb (128.8 kg)     Height --      Head Circumference --      Peak Flow --      Pain Score 05/15/24 1738 10     Pain Loc --      Pain Education --      Exclude from Growth Chart --    No data found.  Updated Vital Signs BP (!) 143/90 (BP Location: Right Wrist)   Pulse 84   Temp 99.1 F (37.3 C) (Oral)   Resp 16   Wt 128.8 kg   LMP  (LMP Unknown)   SpO2 95%   BMI 48.75  kg/m   Visual Acuity  Right Eye Distance:   Left Eye Distance:   Bilateral Distance:    Right Eye Near:   Left Eye Near:    Bilateral Near:     Physical Exam GEN: well appearing female in no acute distress  CVS: well perfused  RESP: speaking in full sentences without pause, no respiratory distress  MSK:   Elbow, left: Inspection yields no evidence of bony deformity, effusion, erythema, ecchymosis, or rash. Limitied Active but full passive ROM in flexion/extension/supination/pronation. Strength 5/5 throughout.  TTP at the medial, lateral epicondyle and olecranon, + pain with finger/wrist extension against resistance. + pain with gripping or finger/wrist flexion against resistance. No evidence of pain or laxity at the UCL.   UC Treatments / Results  Labs (all labs ordered are listed, but only abnormal results are displayed) Labs Reviewed - No data to display  EKG   Radiology DG Elbow Complete Left Result Date: 05/15/2024 CLINICAL DATA:  elbow pain EXAM: LEFT ELBOW - COMPLETE 3+ VIEW COMPARISON:  None Available. FINDINGS: No acute fracture or dislocation. No joint effusion. There is no evidence of arthropathy or other focal bone abnormality. Soft tissues are unremarkable. IMPRESSION: No acute fracture or dislocation. Electronically Signed   By: Rogelia Myers M.D.   On: 05/15/2024 18:53     Procedures Procedures (including critical care time)  Medications Ordered in UC Medications  dexamethasone  (DECADRON ) injection 10 mg (10 mg Intramuscular Given 05/15/24 1938)    Initial Impression / Assessment and Plan / UC Course  I have reviewed the triage vital signs and the nursing notes.  Pertinent labs & imaging results that were available during my care of the patient were reviewed by me and considered in my medical decision making (see chart for details).      Pt is a 48 y.o.  female with 2 days of left forearm and elbow pain.   On exam, pt has tenderness at medial, lateral  epicondyles as well as the olecranon.  Additionally has pain with supination and pronation and limited range of motion.  Obtained left elbow plain films.  Personally interpreted by me were unremarkable for fracture or dislocation. Radiologist report reviewed and additionally notes  no soft tissue swelling.  Discussed treatment options including IM Toradol , oral prednisone , IM Decadron  and patient agreeable to Decadron .  Given Decadron  10 mg IM injection.  She was given a left wrist brace for her wrist pain.  Suspect tendinitis versus bursitis given painful range of motion.  Patient to gradually return to normal activities, as tolerated and continue ordinary activities within the limits permitted by pain. Prescribed gabapentin  and prednisone  for pain relief.  Tylenol  PRN.  Patient to call her primary care doctor and was given parameters for blood sugar and blood pressure expectations.  Patient to follow up with orthopedic provider, if symptoms do not improve with conservative treatment.  Return and ED precautions given. Understanding voiced. Discussed MDM, treatment plan and plan for follow-up with patient/parent who agrees with plan.   Final Clinical Impressions(s) / UC Diagnoses   Final diagnoses:  Left elbow pain  Arthralgia of left forearm  Wrist pain, left     Discharge Instructions      Stop by the pharmacy to pick up your prescriptions.  Follow up with your primary care provider or return to the urgent care, if not improving.   Call your primary care provider if your blood sugar is above 350 or your blood pressure goes higher than 170 / 100.  ED Prescriptions     Medication Sig Dispense Auth. Provider   gabapentin  (NEURONTIN ) 100 MG capsule Take 1 capsule (100 mg total) by mouth 3 (three) times daily as needed. 30 capsule Suzannah Bettes, DO   predniSONE  (DELTASONE ) 10 MG tablet Take 2 tablets (20 mg total) by mouth daily. 15 tablet Kriste Berth, DO      PDMP not  reviewed this encounter.   Saphronia Ozdemir, DO 05/18/24 1015

## 2024-06-26 ENCOUNTER — Ambulatory Visit: Admitting: Obstetrics & Gynecology
